# Patient Record
Sex: Male | Born: 1990 | Race: White | Hispanic: No | Marital: Married | Smoking: Current some day smoker
Health system: Southern US, Community
[De-identification: ages and names within clinical notes are randomized; demographics above are authoritative.]

## PROBLEM LIST (undated history)

## (undated) ENCOUNTER — Emergency Department (HOSPITAL_COMMUNITY): Admission: EM | Payer: Self-pay | Source: Home / Self Care

## (undated) DIAGNOSIS — S61412A Laceration without foreign body of left hand, initial encounter: Secondary | ICD-10-CM

## (undated) HISTORY — PX: HAND SURGERY: SHX662

## (undated) HISTORY — PX: NO PAST SURGERIES: SHX2092

---
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
## 2012-12-25 ENCOUNTER — Emergency Department: Payer: Self-pay | Admitting: Emergency Medicine

## 2013-10-01 ENCOUNTER — Emergency Department (HOSPITAL_COMMUNITY)
Admission: EM | Admit: 2013-10-01 | Discharge: 2013-10-01 | Disposition: A | Discharge status: Home / Self Care | Payer: Self-pay | Attending: Emergency Medicine | Admitting: Emergency Medicine

## 2013-10-01 ENCOUNTER — Emergency Department (HOSPITAL_COMMUNITY): Payer: Self-pay

## 2013-10-01 ENCOUNTER — Encounter (HOSPITAL_COMMUNITY): Payer: Self-pay | Admitting: Emergency Medicine

## 2013-10-01 DIAGNOSIS — R1031 Right lower quadrant pain: Secondary | ICD-10-CM | POA: Insufficient documentation

## 2013-10-01 LAB — CBC WITH DIFFERENTIAL/PLATELET
BASOS ABS: 0 10*3/uL (ref 0.0–0.1)
Basophils Relative: 0 % (ref 0–1)
EOS ABS: 0.3 10*3/uL (ref 0.0–0.7)
EOS PCT: 4 % (ref 0–5)
HCT: 48.5 % (ref 39.0–52.0)
Hemoglobin: 16.4 g/dL (ref 13.0–17.0)
Lymphocytes Relative: 31 % (ref 12–46)
Lymphs Abs: 2.8 10*3/uL (ref 0.7–4.0)
MCH: 27.8 pg (ref 26.0–34.0)
MCHC: 33.8 g/dL (ref 30.0–36.0)
MCV: 82.3 fL (ref 78.0–100.0)
Monocytes Absolute: 0.8 10*3/uL (ref 0.1–1.0)
Monocytes Relative: 9 % (ref 3–12)
Neutro Abs: 5 10*3/uL (ref 1.7–7.7)
Neutrophils Relative %: 56 % (ref 43–77)
PLATELETS: 228 10*3/uL (ref 150–400)
RBC: 5.89 MIL/uL — ABNORMAL HIGH (ref 4.22–5.81)
RDW: 13.2 % (ref 11.5–15.5)
WBC: 8.9 10*3/uL (ref 4.0–10.5)

## 2013-10-01 LAB — COMPREHENSIVE METABOLIC PANEL
ALT: 24 U/L (ref 0–53)
AST: 16 U/L (ref 0–37)
Albumin: 3.8 g/dL (ref 3.5–5.2)
Alkaline Phosphatase: 84 U/L (ref 39–117)
Anion gap: 13 (ref 5–15)
BUN: 14 mg/dL (ref 6–23)
CALCIUM: 9.5 mg/dL (ref 8.4–10.5)
CO2: 26 mEq/L (ref 19–32)
CREATININE: 0.62 mg/dL (ref 0.50–1.35)
Chloride: 101 mEq/L (ref 96–112)
GFR calc non Af Amer: >90 mL/min (ref >90)
GLUCOSE: 104 mg/dL — AB (ref 70–99)
Potassium: 4.4 mEq/L (ref 3.7–5.3)
SODIUM: 140 meq/L (ref 137–147)
TOTAL PROTEIN: 7.6 g/dL (ref 6.0–8.3)
Total Bilirubin: 0.3 mg/dL (ref 0.3–1.2)

## 2013-10-01 LAB — LIPASE, BLOOD: LIPASE: 32 U/L (ref 11–59)

## 2013-10-01 MED ORDER — IOHEXOL 300 MG/ML  SOLN
50.0000 mL | Freq: Once | INTRAMUSCULAR | Status: AC | PRN
  Administered 2013-10-01: 50 mL via ORAL

## 2013-10-01 MED ORDER — IOHEXOL 300 MG/ML  SOLN
100.0000 mL | Freq: Once | INTRAMUSCULAR | Status: AC | PRN
  Administered 2013-10-01: 100 mL via INTRAVENOUS

## 2013-10-01 MED ORDER — NAPROXEN 250 MG PO TABS: 250.0000 mg | ORAL_TABLET | Freq: Two times a day (BID) | ORAL | Status: DC | PRN

## 2013-10-01 MED ORDER — SODIUM CHLORIDE 0.9 % IV SOLN
INTRAVENOUS | Status: DC
  Administered 2013-10-01: 14:00:00 via INTRAVENOUS

## 2013-10-01 NOTE — ED Notes (Signed)
Pt c/o right lower quad abd pain that started Friday, denies any n/v/d, denies any fever,

## 2013-10-01 NOTE — Discharge Instructions (Signed)
°Emergency Department Resource Guide °1) Find a Doctor and Pay Out of Pocket °Although you won't have to find out who is covered by your insurance plan, it is a good idea to ask around and get recommendations. You will then need to call the office and see if the doctor you have chosen will accept you as a new patient and what types of options they offer for patients who are self-pay. Some doctors offer discounts or will set up payment plans for their patients who do not have insurance, but you will need to ask so you aren't surprised when you get to your appointment. ° °2) Contact Your Local Health Department °Not all health departments have doctors that can see patients for sick visits, but many do, so it is worth a call to see if yours does. If you don't know where your local health department is, you can check in your phone book. The CDC also has a tool to help you locate your state's health department, and many state websites also have listings of all of their local health departments. ° °3) Find a Walk-in Clinic °If your illness is not likely to be very severe or complicated, you may want to try a walk in clinic. These are popping up all over the country in pharmacies, drugstores, and shopping centers. They're usually staffed by nurse practitioners or physician assistants that have been trained to treat common illnesses and complaints. They're usually fairly quick and inexpensive. However, if you have serious medical issues or chronic medical problems, these are probably not your best option. ° °No Primary Care Doctor: °- Call [facility] at  832-8000 - they can help you locate a primary care doctor that  accepts your insurance, provides certain services, etc. °- Physician Referral Service- 1-800-533-3463 ° °Chronic Pain Problems: °Organization         Address  Phone   Notes  °[facility]  (336) 297-2271 Patients need to be referred by their primary care doctor.  ° °Medication  Assistance: °Organization         Address  Phone   Notes  °[place] [facility] [address] °[place], [place] [address] (336) 641-8030 --Must be a resident of [place] °-- Must have NO insurance coverage whatsoever (no Medicaid/ Medicare, etc.) °-- The pt. MUST have a primary care doctor that directs their care regularly and follows them in the community °  °[facility]  (866) 331-1348   °[employer]  (888) 892-1162   ° °Agencies that provide inexpensive medical care: °Organization         Address  Phone   Notes  °[facility]  (336) 832-8035   °[facility]    (336) 832-7272   °[facility] [address] °[place], [place] [address] (336) 832-4777   °[facility] [address], °[place] (336) 271-4999   °[facility]    (336) 373-0678   °[facility]    (336) 272-1050   °[facility] ° [address], [place] Phone:  (336) 832-4444, Fax:  (336) 832-4440 Hours of Operation:  9 am - 6 pm, M-F.  Also accepts Medicaid/Medicare and self-pay.  °[facility] ° [address], [place] Phone: (336) 832-3150, Fax: (336) [phone]. Hours of Operation:  8:30 am - 5:30 pm, M-F.  Also accepts Medicaid and self-pay.  °[facility] [place] [address], [place] Phone: (336) 878-6027   °[facility] [address], [place], [place] (336)723-1848, Ext. 123 Mondays & Thursdays: 7-9 AM.  First 15 patients are seen on a first come, first serve basis. °  ° °Medicaid-accepting [place] Providers: ° °Organization         Address  Phone   Notes  °[facility] [address], [place] (336) 641-2100 Also accepts self-pay patients.  °[facility] [address], [place] ° (336) 856-9996   °[facility] [address], [place]  (336) 288-8857   °[facility] [address] [place] [address], [place] (336) 299-7000   °Veita Bland [address], [place]  ° (336) 373-1557 Only accepts [place] Access Medicaid patients after they have their name applied to their card.  ° °Self-Pay (no insurance) in [place]: ° °Organization         Address  Phone   Notes  °Sickle Cell Patients, [facility] [address], [place] (336) 832-1970   °[facility] [address], [place] (336) 832-4400   °[facility] ° [address], [place] (336) 992-4800   °[facility]/Dr. Osei-Bonsu ° [address] [place] [address], [place] or [address], [place] (336) 841-8500 Phone number for both [place] and [place] locations is the same.  °[facility] [address], [place] (336) 299-0000   °[facility] [address] [place] [address], [place] or [address] (336) 852-7530 °(336) 878-2260   °[facility] [address], [place] (336) 350-1642, phone; (336) 294-5005, fax Sees patients 1st and 3rd Saturday of every month.  Must not qualify for public or private insurance (i.e. Medicaid, Medicare, [place] Health Choice, Veterans' Benefits) • Household income should be no more than 200% of the poverty level •The clinic cannot treat you if you are pregnant or think you are pregnant • Sexually transmitted diseases are not treated at the clinic.  ° ° °Dental Care: °Organization         Address  Phone  Notes  °[place] [facility] [address], [place] (336) 641-6152 Accepts children up to age 21 who are enrolled in Medicaid or [place] Health Choice; pregnant women with a Medicaid card; and children who have applied for Medicaid or [place] Health Choice, but were declined, whose parents can pay a reduced fee at time of service.  °[place]  [facility] [place]  [address], [place] (336) 641-7733 Accepts children up to age 21 who are enrolled in Medicaid or [place] Health Choice; pregnant women with a Medicaid card; and children who have applied for Medicaid or [place] Health Choice, but were declined, whose parents can pay a reduced fee at time of service.  °[facility] ° [address], [place] (336) 641-4533 Patients are seen by appointment only. Walk-ins are not accepted. [facility] will see patients 18 years of age and older. °Monday - Tuesday (8am-5pm) °Most Wednesdays (8:30-5pm) °$30 per visit, cash only  °[facility] ° [address], [place] (336) 641-4533 Patients are seen by appointment only. Walk-ins are not accepted. [facility] will see patients 18 years of age and older. °One   Wednesday Evening (Monthly: Volunteer Based).  $30 per visit, cash only  °[facility]  (919) 537-3737 for adults; Children under age 4, call Graduate Pediatric Dentistry at ([phone]) [phone]. Children aged 4-14, please call ([phone]) [phone] to request a pediatric application. ° Dental services are provided in all areas of dental care including fillings, crowns and bridges, complete and partial dentures, implants, gum treatment, root canals, and extractions. Preventive care is also provided. Treatment is provided to both adults and children. °Patients are selected via a lottery and there is often a waiting list. °  °[facility] [address], °[place] ° (336) 763-8833 [email or web address] °  °[facility] [address], [place], [place] (336)723-1848, Ext. 123 Second and Fourth Thursday of each month, opens at 6:30 AM; Clinic ends at 9 AM.  Patients are seen on a first-come first-served basis, and a limited number are seen during each clinic.  ° °[facility] ° [address], [place], [place] (336) 723-7904    Eligibility Requirements °You must have lived in [place], [place], or [place] counties for at least the last three months. °  You cannot be eligible for state or federal sponsored healthcare insurance, including Veterans Administration, Medicaid, or Medicare. °  You generally cannot be eligible for healthcare insurance through your employer.  °  How to apply: °Eligibility screenings are held every Tuesday and Wednesday afternoon from 1:00 pm until 4:00 pm. You do not need an appointment for the interview!  °[facility] [address], [place], [place] 336-631-2330   °[facility]  336-342-8273   °[facility]  336-703-3100   °[facility]  336-570-6415   ° °Behavioral Health Resources in the Community: °Intensive Outpatient Programs °Organization         Address  Phone  Notes  °[facility] [address], [place], [place] 336-878-6098   °[facility] [address], [place], [place] 336-832-9800   °[facility] [address], [place], [place] ° 336-882-2125   °[facility] [address],  °[place], [place] 1-800-853-5163 or 336-641-4981   °Substance Abuse Resources °Organization         Address  Phone  Notes  °[facility]  336-882-2125   °[facility]  336-784-9470   °[facility]  336-285-9073   °[facility]  336-845-3988   °Residential & Outpatient Substance Abuse Program  1-800-659-3381   °Psychological Services °Organization         Address  Phone  Notes  °[facility]  336- 832-9600   °[facility]  336- 378-7881   °[facility] [address], [place] 1-800-853-5163 or 336-641-4981   ° °Mobile Crisis Teams °Organization         Address  Phone  Notes  °[facility], Mobile Crisis Care Unit  1-877-626-1772   °Assertive °[facility] ° [address]  [place], [place] 336-834-9664   °Sharon DeEsch [address] °[place] 336-554-5454   ° °Self-Help/Support Groups °Organization         Address  Phone             Notes  °[facility]  - variety of support groups  336- 373-1402 Call for more information  °Narcotics Anonymous (NA), [facility] [address], °[place]  2 meetings at this location  ° °  Residential Treatment Programs Organization         Address  Phone  Notes  [facility] [address],    [place]  [phone]   [facility]  [address], [place] [phone], [place], [place] [phone]   [facility] [address], [place] [phone] Admissions: 8am-3pm M-F  [facility] [address]    [place], [place] [phone]   [facility] [address], [place], [place] [phone]   [facility] [address]  [place], [place] [phone]   [facility] Intensive Outpatient [address], [place], [place] [phone]   [facility] ([facility] [address]  [place], [place] [phone] or [phone]   [facility] [address] [place], [place] [phone] Accepts Medicaid  [facility] [address]  [place] [phone] Substance Abuse/Addiction Treatment   [facility]         Address  Phone  Notes  [facility]  [phone](888) (504) [phone]   Angie FavaJulie Brannon, PhD [address], [address] [place], [place]   [phone](336) [phone] or [phone](336) [phone]   [facility]   [address] [place], [place] [phone](336) [phone]   [facility] [address], [place], [place] ([phone](336) [phone] Insurance/Medicaid/sponsorship through [facility] [address]                                    [place], [place] [phone](336) [phone] Therapy/tele-psych/case    [facility] [address]   [place], [place] [phone](336) [phone]    Dr. Lolly MustacheArfeen  [phone](336) [phone]   [facility] [facility]  [employer] [facility] 1) [address], [place] 2) [address], [place] 3)  [address], [place] [phone](336) [phone](336) [phone]  [phone](336) [phone]   [facility] Child Abuse Hotline 779-302-0344(336) (657) 456-5009 or (904) [phone](336) [phone] (After Hours)       Take the prescription as directed. Take over the counter tylenol, as directed on packaging, as needed for discomfort. Apply moist heat or ice to the area(s) of discomfort, for 15 minutes at a time, several times per day for the next few days.  Do not fall asleep on a heating or ice pack.  Call your regular medical doctor on Monday to schedule a follow up appointment in 2 days.  Return to the Emergency Department immediately if worsening.

## 2013-10-01 NOTE — ED Provider Notes (Signed)
CSN: [id number]     Arrival date & time 10/01/13  1333 History   First MD Initiated Contact with Patient 10/01/13 1351     Chief Complaint  Patient presents with  . Abdominal Pain      HPI Pt was seen at 1355.  Per pt, c/o gradual onset and persistence of constant RLQ abd "pain" that began 2 days ago.  Describes the abd pain as "sore," worsens with palpation of the area and body position changes.  Denies injury, no testicular pain/swelling, no dysuria/hematuria, N/V/D, no fevers, no back pain, no rash, no CP/SOB, no black or blood in stools.       History reviewed. No pertinent past medical history.  History reviewed. No pertinent past surgical history.  History  Substance Use Topics  . Smoking status: Never Smoker   . Smokeless tobacco: Not on file  . Alcohol Use: No    Review of Systems ROS: Statement: All systems negative except as marked or noted in the HPI; Constitutional: Negative for fever and chills. ; ; Eyes: Negative for eye pain, redness and discharge. ; ; ENMT: Negative for ear pain, hoarseness, nasal congestion, sinus pressure and sore throat. ; ; Cardiovascular: Negative for chest pain, palpitations, diaphoresis, dyspnea and peripheral edema. ; ; Respiratory: Negative for cough, wheezing and stridor. ; ; Gastrointestinal: +abd pain. Negative for nausea, vomiting, diarrhea, blood in stool, hematemesis, jaundice and rectal bleeding. . ; ; Genitourinary: Negative for dysuria, flank pain and hematuria. ; ; Genital:  No penile drainage or rash, no testicular pain or swelling, no scrotal rash or swelling. ;; Musculoskeletal: Negative for back pain and neck pain. Negative for swelling and trauma.; ; Skin: Negative for pruritus, rash, abrasions, blisters, bruising and skin lesion.; ; Neuro: Negative for headache, lightheadedness and neck stiffness. Negative for weakness, altered level of consciousness , altered mental status, extremity weakness, paresthesias, involuntary movement,  seizure and syncope.     Allergies  Ceclor and Septra  Home Medications   Prior to Admission medications   Medication Sig Start Date End Date Taking? Authorizing Provider  Aspirin-Caffeine (BAYER BACK & BODY PAIN EX ST) 500-32.5 MG TABS Take 2 tablets by mouth every 6 (six) hours as needed (Pain).   Yes Historical Provider, MD   BP 126/72  Pulse 67  Temp(Src) 97.7 F (36.5 C) (Oral)  Resp 16  Ht 6' (1.829 m)  Wt 249 lb 9 oz (113.201 kg)  BMI 33.84 kg/m2  SpO2 98% Physical Exam 1400; Physical examination:  Nursing notes reviewed; Vital signs and O2 SAT reviewed;  Constitutional: Well developed, Well nourished, Well hydrated, In no acute distress; Head:  Normocephalic, atraumatic; Eyes: EOMI, PERRL, No scleral icterus; ENMT: Mouth and pharynx normal, Mucous membranes moist; Neck: Supple, Full range of motion, No lymphadenopathy; Cardiovascular: Regular rate and rhythm, No murmur, rub, or gallop; Respiratory: Breath sounds clear & equal bilaterally, No rales, rhonchi, wheezes.  Speaking full sentences with ease, Normal respiratory effort/excursion; Chest: Nontender, Movement normal; Abdomen: Soft, +mild RLQ tenderness to palp. No rebound or guarding. Nondistended, Normal bowel sounds; Genitourinary: No CVA tenderness; Extremities: Pulses normal, No tenderness, No edema, No calf edema or asymmetry.; Neuro: AA&Ox3, Major CN grossly intact.  Speech clear. No gross focal motor or sensory deficits in extremities.; Skin: Color normal, Warm, Dry.   ED Course  Procedures     MDM  MDM Reviewed: previous chart, nursing note and vitals Reviewed previous: labs Interpretation: labs and CT scan    Results for orders  placed during the hospital encounter of 10/01/13  CBC WITH DIFFERENTIAL      Result Value Ref Range   WBC 8.9  4.0 - 10.5 K/uL   RBC 5.89 (*) 4.22 - 5.81 MIL/uL   Hemoglobin 16.4  13.0 - 17.0 g/dL   HCT 16.148.5  09.639.0 - 04.552.0 %   MCV 82.3  78.0 - 100.0 fL   MCH 27.8  26.0 - 34.0  pg   MCHC 33.8  30.0 - 36.0 g/dL   RDW 40.913.2  81.111.5 - 91.415.5 %   Platelets 228  150 - 400 K/uL   Neutrophils Relative % 56  43 - 77 %   Neutro Abs 5.0  1.7 - 7.7 K/uL   Lymphocytes Relative 31  12 - 46 %   Lymphs Abs 2.8  0.7 - 4.0 K/uL   Monocytes Relative 9  3 - 12 %   Monocytes Absolute 0.8  0.1 - 1.0 K/uL   Eosinophils Relative 4  0 - 5 %   Eosinophils Absolute 0.3  0.0 - 0.7 K/uL   Basophils Relative 0  0 - 1 %   Basophils Absolute 0.0  0.0 - 0.1 K/uL  COMPREHENSIVE METABOLIC PANEL      Result Value Ref Range   Sodium 140  137 - 147 mEq/L   Potassium 4.4  3.7 - 5.3 mEq/L   Chloride 101  96 - 112 mEq/L   CO2 26  19 - 32 mEq/L   Glucose, Bld 104 (*) 70 - 99 mg/dL   BUN 14  6 - 23 mg/dL   Creatinine, Ser 7.820.62  0.50 - 1.35 mg/dL   Calcium 9.5  8.4 - 95.610.5 mg/dL   Total Protein 7.6  6.0 - 8.3 g/dL   Albumin 3.8  3.5 - 5.2 g/dL   AST 16  0 - 37 U/L   ALT 24  0 - 53 U/L   Alkaline Phosphatase 84  39 - 117 U/L   Total Bilirubin 0.3  0.3 - 1.2 mg/dL   GFR calc non Af Amer >90  >90 mL/min   GFR calc Af Amer >90  >90 mL/min   Anion gap 13  5 - 15  LIPASE, BLOOD      Result Value Ref Range   Lipase 32  11 - 59 U/L   Ct Abdomen Pelvis W Contrast 10/01/2013   CLINICAL DATA:  Right lower quadrant abdominal pain for the past 3 days.  EXAM: CT ABDOMEN AND PELVIS WITH CONTRAST  TECHNIQUE: Multidetector CT imaging of the abdomen and pelvis was performed using the standard protocol following bolus administration of intravenous contrast.  CONTRAST:  50mL OMNIPAQUE IOHEXOL 300 MG/ML SOLN, 100mL OMNIPAQUE IOHEXOL 300 MG/ML SOLN  COMPARISON:  None.  FINDINGS: Normal appearing liver, spleen, pancreas, gallbladder, adrenal glands, kidneys, urinary bladder and prostate gland. No gastrointestinal abnormalities or enlarged lymph nodes. Normal appearing appendix. Clear lung bases. Mild lower thoracic spine degenerative changes.  IMPRESSION: No acute abnormality.  No evidence of appendicitis.   Electronically  Signed   By: Gordan PaymentSteve  Reid M.D.   On: 10/01/2013 15:34    1545:  Workup ressuring. Tx symptomatically. Dx and testing d/w pt and family.  Questions answered.  Verb understanding, agreeable to d/c home with outpt f/u.    Laray AngerKathleen M Keena Heesch, DO 10/03/13 1224

## 2014-03-04 ENCOUNTER — Emergency Department (HOSPITAL_COMMUNITY)
Admission: EM | Admit: 2014-03-04 | Discharge: 2014-03-05 | Disposition: A | Discharge status: Home / Self Care | Payer: Self-pay | Attending: Emergency Medicine | Admitting: Emergency Medicine

## 2014-03-04 ENCOUNTER — Encounter (HOSPITAL_COMMUNITY): Payer: Self-pay

## 2014-03-04 DIAGNOSIS — R1011 Right upper quadrant pain: Secondary | ICD-10-CM | POA: Insufficient documentation

## 2014-03-04 DIAGNOSIS — R197 Diarrhea, unspecified: Secondary | ICD-10-CM | POA: Insufficient documentation

## 2014-03-04 DIAGNOSIS — R112 Nausea with vomiting, unspecified: Secondary | ICD-10-CM | POA: Insufficient documentation

## 2014-03-04 DIAGNOSIS — R1110 Vomiting, unspecified: Secondary | ICD-10-CM

## 2014-03-04 NOTE — ED Provider Notes (Signed)
CSN: [id number]     Arrival date & time 03/04/14  2148 History  This chart was scribed for Bruce SeamenJohn L Jantzen Pilger, MD by Tonye RoyaltyJoshua Chen, ED Scribe. This patient was seen in room APA12/APA12 and the patient's care was started at 11:06 PM.    Chief Complaint  Patient presents with  . Vomiting    The history is provided by the patient. No language interpreter was used.    HPI Comments: Bruce Griffith is a 23 y.o. male who presents to the Emergency Department complaining of nausea and vomiting after eating, onset 1.5 weeks ago. He states that after eating he has either vomits 5-10 minutes later or has diarrhea 30 minutes after; he states they have occurred together only twice. He states no particular type of food seems to cause it and he is able to drink fluids normally. He denies overeating. He states that now he has vomiting after eating approximately every other day. He states he began having RUQ abdominal pain today; he had not been having abdominal pain previously. He rates pain at 4/10 and states it is painful to move or bend. He denies fever, chills, or dysuria. He denies nausea at this time. He reports Fhx of gallbladder and ulcer problems.  History reviewed. No pertinent past medical history. History reviewed. No pertinent past surgical history. No family history on file. History  Substance Use Topics  . Smoking status: Never Smoker   . Smokeless tobacco: Not on file  . Alcohol Use: No    Review of Systems A complete 10 system review of systems was obtained and all systems are negative except as noted in the HPI and PMH.    Allergies  Ceclor and Septra  Home Medications   Prior to Admission medications   Medication Sig Start Date End Date Taking? Authorizing Provider  naproxen (NAPROSYN) 250 MG tablet Take 1 tablet (250 mg total) by mouth 2 (two) times daily as needed for mild pain or moderate pain (Take with food). Patient not taking: Reported on 03/04/2014 10/01/13   Samuel JesterKathleen McManus, DO    BP 142/82 mmHg  Pulse 73  Temp(Src) 98.9 F (37.2 C)  Resp 16  Ht 6' (1.829 m)  Wt 264 lb (119.75 kg)  BMI 35.80 kg/m2  SpO2 100%   Physical Exam  Nursing note and vitals reviewed.  General: Well-developed, well-nourished male in no acute distress; appearance consistent with age of record HENT: normocephalic; atraumatic Eyes: pupils equal, round and reactive to light; extraocular muscles intact Neck: supple Heart: regular rate and rhythm Lungs: clear to auscultation bilaterally Abdomen: soft; nondistended; no masses or hepatosplenomegaly; bowel sounds present; mild RUQ tenderness Extremities: No deformity; full range of motion Neurologic: Awake, alert and oriented; motor function intact in all extremities and symmetric; no facial droop Skin: Warm and dry Psychiatric: Normal mood and affect    ED Course  Procedures (including critical care time)  DIAGNOSTIC STUDIES: Oxygen Saturation is 100% on room air, normal by my interpretation.    COORDINATION OF CARE: 11:13 PM Discussed with patient that since he has not been having pain until today, his symptoms are unlikely to be due to gallbladder ot ulcer problems. Discussed treatment plan with patient at beside, including lab work. The patient agrees with the plan and has no further questions at this time.    MDM   Nursing notes and vitals signs, including pulse oximetry, reviewed.  Summary of this visit's results, reviewed by myself:  Labs:  Results for orders placed  or performed during the hospital encounter of 03/04/14 (from the past 24 hour(s))  CBC with Differential     Status: Abnormal   Collection Time: 03/05/14 12:16 AM  Result Value Ref Range   WBC 10.9 (H) 4.0 - 10.5 K/uL   RBC 5.69 4.22 - 5.81 MIL/uL   Hemoglobin 15.6 13.0 - 17.0 g/dL   HCT 16.146.6 09.639.0 - 04.552.0 %   MCV 81.9 78.0 - 100.0 fL   MCH 27.4 26.0 - 34.0 pg   MCHC 33.5 30.0 - 36.0 g/dL   RDW 40.913.5 81.111.5 - 91.415.5 %   Platelets 278 150 - 400 K/uL    Neutrophils Relative % 55 43 - 77 %   Neutro Abs 6.1 1.7 - 7.7 K/uL   Lymphocytes Relative 32 12 - 46 %   Lymphs Abs 3.5 0.7 - 4.0 K/uL   Monocytes Relative 10 3 - 12 %   Monocytes Absolute 1.1 (H) 0.1 - 1.0 K/uL   Eosinophils Relative 3 0 - 5 %   Eosinophils Absolute 0.3 0.0 - 0.7 K/uL   Basophils Relative 0 0 - 1 %   Basophils Absolute 0.0 0.0 - 0.1 K/uL  Comprehensive metabolic panel     Status: None   Collection Time: 03/05/14 12:16 AM  Result Value Ref Range   Sodium 142 137 - 147 mEq/L   Potassium 4.0 3.7 - 5.3 mEq/L   Chloride 103 96 - 112 mEq/L   CO2 28 19 - 32 mEq/L   Glucose, Bld 82 70 - 99 mg/dL   BUN 7 6 - 23 mg/dL   Creatinine, Ser 7.820.69 0.50 - 1.35 mg/dL   Calcium 9.1 8.4 - 95.610.5 mg/dL   Total Protein 7.1 6.0 - 8.3 g/dL   Albumin 3.6 3.5 - 5.2 g/dL   AST 18 0 - 37 U/L   ALT 29 0 - 53 U/L   Alkaline Phosphatase 88 39 - 117 U/L   Total Bilirubin 0.5 0.3 - 1.2 mg/dL   GFR calc non Af Amer >90 >90 mL/min   GFR calc Af Amer >90 >90 mL/min   Anion gap 11 5 - 15  Lipase, blood     Status: None   Collection Time: 03/05/14 12:16 AM  Result Value Ref Range   Lipase 27 11 - 59 U/L   2:16 AM Patient's pain is improved and he is minimally tender in the right upper quadrant. The cause of his symptoms is not apparent at this time. We will treat him symptomatically and refer him to gastroenterology for follow-up.  I personally performed the services described in this documentation, which was scribed in my presence. The recorded information has been reviewed and is accurate.   Bruce SeamenJohn L Chanie Soucek, MD 03/05/14 [phone]

## 2014-03-04 NOTE — ED Notes (Signed)
Intermittent nausea and vomiting for 10 days, upper abd pain started today

## 2014-03-05 LAB — COMPREHENSIVE METABOLIC PANEL
ALK PHOS: 88 U/L (ref 39–117)
ALT: 29 U/L (ref 0–53)
AST: 18 U/L (ref 0–37)
Albumin: 3.6 g/dL (ref 3.5–5.2)
Anion gap: 11 (ref 5–15)
BILIRUBIN TOTAL: 0.5 mg/dL (ref 0.3–1.2)
BUN: 7 mg/dL (ref 6–23)
CHLORIDE: 103 meq/L (ref 96–112)
CO2: 28 meq/L (ref 19–32)
Calcium: 9.1 mg/dL (ref 8.4–10.5)
Creatinine, Ser: 0.69 mg/dL (ref 0.50–1.35)
GLUCOSE: 82 mg/dL (ref 70–99)
POTASSIUM: 4 meq/L (ref 3.7–5.3)
SODIUM: 142 meq/L (ref 137–147)
Total Protein: 7.1 g/dL (ref 6.0–8.3)

## 2014-03-05 LAB — CBC WITH DIFFERENTIAL/PLATELET
Basophils Absolute: 0 10*3/uL (ref 0.0–0.1)
Basophils Relative: 0 % (ref 0–1)
EOS PCT: 3 % (ref 0–5)
Eosinophils Absolute: 0.3 10*3/uL (ref 0.0–0.7)
HCT: 46.6 % (ref 39.0–52.0)
Hemoglobin: 15.6 g/dL (ref 13.0–17.0)
LYMPHS ABS: 3.5 10*3/uL (ref 0.7–4.0)
LYMPHS PCT: 32 % (ref 12–46)
MCH: 27.4 pg (ref 26.0–34.0)
MCHC: 33.5 g/dL (ref 30.0–36.0)
MCV: 81.9 fL (ref 78.0–100.0)
Monocytes Absolute: 1.1 10*3/uL — ABNORMAL HIGH (ref 0.1–1.0)
Monocytes Relative: 10 % (ref 3–12)
NEUTROS ABS: 6.1 10*3/uL (ref 1.7–7.7)
NEUTROS PCT: 55 % (ref 43–77)
PLATELETS: 278 10*3/uL (ref 150–400)
RBC: 5.69 MIL/uL (ref 4.22–5.81)
RDW: 13.5 % (ref 11.5–15.5)
WBC: 10.9 10*3/uL — AB (ref 4.0–10.5)

## 2014-03-05 LAB — LIPASE, BLOOD: Lipase: 27 U/L (ref 11–59)

## 2014-03-05 MED ORDER — ONDANSETRON 8 MG PO TBDP: 8.0000 mg | ORAL_TABLET | Freq: Three times a day (TID) | ORAL | Status: DC | PRN

## 2014-03-05 NOTE — Discharge Instructions (Signed)

## 2014-07-09 ENCOUNTER — Encounter (HOSPITAL_COMMUNITY): Payer: Self-pay | Admitting: Cardiology

## 2014-07-09 ENCOUNTER — Emergency Department (HOSPITAL_COMMUNITY)
Admission: EM | Admit: 2014-07-09 | Discharge: 2014-07-09 | Disposition: A | Discharge status: Home / Self Care | Payer: Self-pay | Attending: Emergency Medicine | Admitting: Emergency Medicine

## 2014-07-09 DIAGNOSIS — J029 Acute pharyngitis, unspecified: Secondary | ICD-10-CM | POA: Insufficient documentation

## 2014-07-09 DIAGNOSIS — Z79899 Other long term (current) drug therapy: Secondary | ICD-10-CM | POA: Insufficient documentation

## 2014-07-09 MED ORDER — PENICILLIN G BENZATHINE 1200000 UNIT/2ML IM SUSP
1.2000 10*6.[IU] | Freq: Once | INTRAMUSCULAR | Status: AC
  Administered 2014-07-09: 1.2 10*6.[IU] via INTRAMUSCULAR
  Filled 2014-07-09: qty 2

## 2014-07-09 MED ORDER — DIPHENHYDRAMINE HCL 25 MG PO CAPS
25.0000 mg | ORAL_CAPSULE | Freq: Once | ORAL | Status: AC
  Administered 2014-07-09: 25 mg via ORAL
  Filled 2014-07-09: qty 1

## 2014-07-09 MED ORDER — IBUPROFEN 800 MG PO TABS
800.0000 mg | ORAL_TABLET | Freq: Once | ORAL | Status: AC
  Administered 2014-07-09: 800 mg via ORAL
  Filled 2014-07-09: qty 1

## 2014-07-09 MED ORDER — ACETAMINOPHEN-CODEINE #3 300-30 MG PO TABS: 1.0000 | ORAL_TABLET | Freq: Four times a day (QID) | ORAL | Status: DC | PRN

## 2014-07-09 NOTE — ED Notes (Signed)
Sore throat  Since yesterday.  Throat red , with swollen tonsils with exudate.

## 2014-07-09 NOTE — Discharge Instructions (Signed)
Your examination is consistent with a severe pharyngitis. Please increase fluids. Please wash hands frequently. Please use salt water gargles every 4 hours. Use Tylenol every 4 hours, or ibuprofen every 6 hours for fever or aching. Use Tylenol codeine for more severe pain. This medication may cause drowsiness, please use with caution. Please use a mask until symptoms have completely resolved. Pharyngitis Pharyngitis is a sore throat (pharynx). There is redness, pain, and swelling of your throat. HOME CARE   Drink enough fluids to keep your pee (urine) clear or pale yellow.  Only take medicine as told by your doctor.  You may get sick again if you do not take medicine as told. Finish your medicines, even if you start to feel better.  Do not take aspirin.  Rest.  Rinse your mouth (gargle) with salt water ( tsp of salt per 1 qt of water) every 1-2 hours. This will help the pain.  If you are not at risk for choking, you can suck on hard candy or sore throat lozenges. GET HELP IF:  You have large, tender lumps on your neck.  You have a rash.  You cough up green, yellow-brown, or bloody spit. GET HELP RIGHT AWAY IF:   You have a stiff neck.  You drool or cannot swallow liquids.  You throw up (vomit) or are not able to keep medicine or liquids down.  You have very bad pain that does not go away with medicine.  You have problems breathing (not from a stuffy nose). MAKE SURE YOU:   Understand these instructions.  Will watch your condition.  Will get help right away if you are not doing well or get worse. Document Released: 09/02/2007 Document Revised: 01/04/2013 Document Reviewed: 11/21/2012 [facility] Patient Information 2015 [place]. This information is not intended to replace advice given to you by your health care provider. Make sure you discuss any questions you have with your health care provider.

## 2014-07-09 NOTE — ED Notes (Signed)
Fever and sore throat since yesterday.  

## 2014-07-09 NOTE — ED Provider Notes (Signed)
CSN: [id number]     Arrival date & time 07/09/14  1013 History  This chart was scribed for non-physician practitioner, Ivery QualeHobson Varick Keys, working with Shon Batonourtney F Horton, MD by Richarda Overlieichard Holland, ED Scribe. This patient was seen in room APFT24/APFT24 and the patient's care was started at 11:39 AM.    Chief Complaint  Patient presents with  . Sore Throat   HPI HPI Comments: Bruce Griffith is a 24 y.o. male with a history of vertigo who presents to the Emergency Department complaining of a sore throat that started yesterday morning. Pt reports associated subjective fever, nausea, vomiting yesterday and rhinorrhea as well. He says that he is able to keep fluids down at this time. Pt states he takes no medications regularly. He reports no alleviating or exacerbating factors at this time. He reports he is allergic ceclor and septra. Pt denies rash.   History reviewed. No pertinent past medical history. History reviewed. No pertinent past surgical history. History reviewed. No pertinent family history. History  Substance Use Topics  . Smoking status: Never Smoker   . Smokeless tobacco: Not on file  . Alcohol Use: No    Review of Systems  Constitutional: Positive for fever.  HENT: Positive for sore throat.   Gastrointestinal: Positive for nausea and vomiting.  All other systems reviewed and are negative.  Allergies  Ceclor and Septra  Home Medications   Prior to Admission medications   Medication Sig Start Date End Date Taking? Authorizing Provider  ondansetron (ZOFRAN ODT) 8 MG disintegrating tablet Take 1 tablet (8 mg total) by mouth every 8 (eight) hours as needed for nausea or vomiting. 8mg  ODT q4 hours prn nausea Patient not taking: Reported on 07/09/2014 03/05/14   Paula LibraJohn Molpus, MD   BP 123/73 mmHg  Pulse 101  Temp(Src) 99.6 F (37.6 C) (Oral)  Resp 18  Ht 6' (1.829 m)  Wt 265 lb (120.203 kg)  BMI 35.93 kg/m2  SpO2 99% Physical Exam  Constitutional: He is oriented to person,  place, and time. He appears well-developed and well-nourished.  HENT:  Head: Normocephalic and atraumatic.  Right Ear: Tympanic membrane, external ear and ear canal normal.  Left Ear: Tympanic membrane, external ear and ear canal normal.  Mouth/Throat: Oropharyngeal exudate present.  Bilateral tonsil swelling. Exudate present. Uvula is enlarged but in the midline. Airway is patent.   Neck: Neck supple. No tracheal deviation present.  Few palpable submental nodes. Trachea is midline.   Cardiovascular: Normal rate, regular rhythm and normal heart sounds.   Pulmonary/Chest: Effort normal and breath sounds normal. No respiratory distress. He has no wheezes. He has no rales.  Abdominal: He exhibits no distension.  Neurological: He is alert and oriented to person, place, and time.  Skin: Skin is warm and dry. No rash noted.  No rash in the palms.   Psychiatric: He has a normal mood and affect. His behavior is normal.  Nursing note and vitals reviewed.   ED Course  Procedures   DIAGNOSTIC STUDIES: Oxygen Saturation is 99% on RA, normal by my interpretation.    COORDINATION OF CARE: 11:46 AM Discussed treatment plan with pt at bedside and pt agreed to plan.   Labs Review Labs Reviewed - No data to display  Imaging Review No results found.   EKG Interpretation None      MDM  Pt tachycardic at 101. Temp 99.6 Pulse ox 99% on room air.  Exam is consistent with pharyngitis. No rash. No excessive headache or neck stiffness.  Plan - salt water gargles. Increase fluids. Bicillin IM and Rx for tylenol codeine.    Final diagnoses:  None    **I have reviewed nursing notes, vital signs, and all appropriate lab and imaging results for this patient. I personally performed the services described in this documentation, which was scribed in my presence. The recorded information has been reviewed and is accurate.     Ivery Quale, PA-C 07/12/14 0454  Shon Baton, MD 07/13/14  [phone]

## 2014-08-13 ENCOUNTER — Encounter (HOSPITAL_COMMUNITY): Payer: Self-pay | Admitting: Emergency Medicine

## 2014-08-13 ENCOUNTER — Emergency Department (HOSPITAL_COMMUNITY)
Admission: EM | Admit: 2014-08-13 | Discharge: 2014-08-13 | Disposition: A | Discharge status: Home / Self Care | Payer: Self-pay | Attending: Emergency Medicine | Admitting: Emergency Medicine

## 2014-08-13 DIAGNOSIS — Z79899 Other long term (current) drug therapy: Secondary | ICD-10-CM | POA: Insufficient documentation

## 2014-08-13 DIAGNOSIS — J039 Acute tonsillitis, unspecified: Secondary | ICD-10-CM | POA: Insufficient documentation

## 2014-08-13 MED ORDER — DEXAMETHASONE SODIUM PHOSPHATE 4 MG/ML IJ SOLN
8.0000 mg | Freq: Once | INTRAMUSCULAR | Status: AC
  Administered 2014-08-13: 8 mg via INTRAMUSCULAR
  Filled 2014-08-13: qty 2

## 2014-08-13 MED ORDER — CLINDAMYCIN HCL 300 MG PO CAPS: 300.0000 mg | ORAL_CAPSULE | Freq: Three times a day (TID) | ORAL | Status: DC

## 2014-08-13 MED ORDER — CLINDAMYCIN HCL 150 MG PO CAPS
300.0000 mg | ORAL_CAPSULE | Freq: Once | ORAL | Status: AC
  Administered 2014-08-13: 300 mg via ORAL
  Filled 2014-08-13: qty 2

## 2014-08-13 NOTE — ED Provider Notes (Signed)
CSN: [id number]     Arrival date & time 08/13/14  1634 History  This chart was scribed for Burgess AmorJulie Brigido Mera, PA-C, working with Nelva Nayobert Beaton, MD by Leona CarryG. Clay Sherrill, ED Scribe. The patient was seen in APFT21/APFT21. The patient's care was started at 6:43 PM.     Chief Complaint  Patient presents with  . Sore Throat   HPI HPI Comments: Bruce Griffith is a 24 y.o. male who presents to the Emergency Department complaining of a constant sore throat beginning approximately five days ago. Patient reports that the pain was more severe when he woke up this morning. He reports that he developed tightness in his throat while at work today.  Patient states that he has taken cough drops and a "sore throat spray" with no relief of his symptoms. Patient denies ear pain or SOB. Patient denies a history of smoking.  Patient does not have a PCP.   History reviewed. No pertinent past medical history. History reviewed. No pertinent past surgical history. History reviewed. No pertinent family history. History  Substance Use Topics  . Smoking status: Never Smoker   . Smokeless tobacco: Not on file  . Alcohol Use: No    Review of Systems  Constitutional: Negative for fever and chills.  HENT: Positive for sore throat. Negative for congestion, ear pain, rhinorrhea, sinus pressure, trouble swallowing and voice change.   Eyes: Negative for discharge.  Respiratory: Positive for cough. Negative for shortness of breath, wheezing and stridor.   Cardiovascular: Negative for chest pain.  Gastrointestinal: Negative for abdominal pain.  Genitourinary: Negative.       Allergies  Ceclor and Septra  Home Medications   Prior to Admission medications   Medication Sig Start Date End Date Taking? Authorizing Provider  acetaminophen-codeine (TYLENOL #3) 300-30 MG per tablet Take 1-2 tablets by mouth every 6 (six) hours as needed for moderate pain. 07/09/14   Ivery QualeHobson Bryant, PA-C  clindamycin (CLEOCIN) 300 MG capsule Take  1 capsule (300 mg total) by mouth 3 (three) times daily. 08/13/14   Burgess AmorJulie Danny Zimny, PA-C  ondansetron (ZOFRAN ODT) 8 MG disintegrating tablet Take 1 tablet (8 mg total) by mouth every 8 (eight) hours as needed for nausea or vomiting. 8mg  ODT q4 hours prn nausea Patient not taking: Reported on 07/09/2014 03/05/14   Paula LibraJohn Molpus, MD   Triage Vitals: BP 130/82 mmHg  Pulse 86  Temp(Src) 99.5 F (37.5 C) (Oral)  Resp 20  Ht 6\' 1"  (1.854 m)  Wt 265 lb (120.203 kg)  BMI 34.97 kg/m2  SpO2 100% Physical Exam  Constitutional: He is oriented to person, place, and time. He appears well-developed and well-nourished.  HENT:  Head: Normocephalic and atraumatic.  Right Ear: Tympanic membrane and ear canal normal.  Left Ear: Tympanic membrane and ear canal normal.  Nose: No mucosal edema or rhinorrhea.  Mouth/Throat: Uvula is midline, oropharynx is clear and moist and mucous membranes are normal. No oropharyngeal exudate, posterior oropharyngeal edema, posterior oropharyngeal erythema or tonsillar abscesses.  3+ hypertrophy tonsils touching the uvula. Symmetric. No surrounding edema. Erythema with traces of exudate.  Eyes: Conjunctivae are normal.  Cardiovascular: Normal rate and normal heart sounds.   Pulmonary/Chest: Effort normal and breath sounds normal. No stridor. No respiratory distress. He has no wheezes. He has no rales.  Abdominal: Soft. There is no tenderness.  Musculoskeletal: Normal range of motion.  Neurological: He is alert and oriented to person, place, and time.  Skin: Skin is warm and dry. No rash noted.  Psychiatric: He has a normal mood and affect.    ED Course  Procedures (including critical care time) DIAGNOSTIC STUDIES: Oxygen Saturation is 100% on room air, normal by my interpretation.    COORDINATION OF CARE:    Labs Review Labs Reviewed - No data to display  Imaging Review No results found.   EKG Interpretation None      MDM   Final diagnoses:  Acute  tonsillitis    Pt with acute tonsillitis.  Decadron 10 mg IM given to help reduce swelling.  Clindamycin prescribed as he was treated for similar sx just 3 weeks ago with Bicillin.  Advised recheck for any worsened sx.  Rest, increased fluid intake, motrin for fever, throat pain.  Pt concerned about need to have tonsillectomy. Advised patient that it is probably not in his best interest to have this surgery, but if he continues to have episodes of tonsillitis he may discuss with ENT. Referral given for prn need for recurrent or persistent sx.   The patient appears reasonably screened and/or stabilized for discharge and I doubt any other medical condition or other [facility] requiring further screening, evaluation, or treatment in the ED at this time prior to discharge.  I personally performed the services described in this documentation, which was scribed in my presence. The recorded information has been reviewed and is accurate.   Burgess AmorJulie Klayton Monie, PA-C 08/15/14 [id number]  Nelva Nayobert Beaton, MD 08/16/14 [phone]

## 2014-08-13 NOTE — ED Notes (Signed)
Pt states that he has had a sore throat for about a week.

## 2014-08-13 NOTE — Discharge Instructions (Signed)

## 2014-08-13 NOTE — ED Notes (Signed)
J. Idol, PA at bedside. 

## 2015-09-09 ENCOUNTER — Encounter (HOSPITAL_COMMUNITY): Payer: Self-pay | Admitting: Emergency Medicine

## 2015-09-09 ENCOUNTER — Emergency Department (HOSPITAL_COMMUNITY)
Admission: EM | Admit: 2015-09-09 | Discharge: 2015-09-09 | Disposition: A | Discharge status: Home / Self Care | Payer: Self-pay | Attending: Emergency Medicine | Admitting: Emergency Medicine

## 2015-09-09 DIAGNOSIS — R55 Syncope and collapse: Secondary | ICD-10-CM | POA: Insufficient documentation

## 2015-09-09 DIAGNOSIS — L409 Psoriasis, unspecified: Secondary | ICD-10-CM | POA: Insufficient documentation

## 2015-09-09 DIAGNOSIS — Z79899 Other long term (current) drug therapy: Secondary | ICD-10-CM | POA: Insufficient documentation

## 2015-09-09 LAB — BASIC METABOLIC PANEL
ANION GAP: 9 (ref 5–15)
BUN: 11 mg/dL (ref 6–20)
CALCIUM: 9.3 mg/dL (ref 8.9–10.3)
CO2: 26 mmol/L (ref 22–32)
Chloride: 103 mmol/L (ref 101–111)
Creatinine, Ser: 0.74 mg/dL (ref 0.61–1.24)
GFR calc Af Amer: >60 mL/min (ref >60)
Glucose, Bld: 100 mg/dL — ABNORMAL HIGH (ref 65–99)
Potassium: 4 mmol/L (ref 3.5–5.1)
Sodium: 138 mmol/L (ref 135–145)

## 2015-09-09 LAB — CBC
HEMATOCRIT: 48.1 % (ref 39.0–52.0)
Hemoglobin: 15.3 g/dL (ref 13.0–17.0)
MCH: 26.1 pg (ref 26.0–34.0)
MCHC: 31.8 g/dL (ref 30.0–36.0)
MCV: 82.1 fL (ref 78.0–100.0)
PLATELETS: 245 10*3/uL (ref 150–400)
RBC: 5.86 MIL/uL — ABNORMAL HIGH (ref 4.22–5.81)
RDW: 13.3 % (ref 11.5–15.5)
WBC: 13.2 10*3/uL — AB (ref 4.0–10.5)

## 2015-09-09 LAB — CBG MONITORING, ED: GLUCOSE-CAPILLARY: 132 mg/dL — AB (ref 65–99)

## 2015-09-09 MED ORDER — SODIUM CHLORIDE 0.9 % IV BOLUS (SEPSIS)
1000.0000 mL | Freq: Once | INTRAVENOUS | Status: AC
  Administered 2015-09-09: 1000 mL via INTRAVENOUS
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
## 2015-09-09 NOTE — ED Notes (Signed)
CBG 132 in triage.

## 2015-09-09 NOTE — ED Notes (Signed)
Pt alert & oriented x4, stable gait. Patient given discharge instructions, paperwork & prescription(s). Patient  instructed to stop at the registration desk to finish any additional paperwork. Patient verbalized understanding. Pt left department w/ no further questions. 

## 2015-09-09 NOTE — Discharge Instructions (Signed)

## 2015-09-09 NOTE — ED Notes (Signed)
Pt ambulated to restroom & returned to room w/ no complications. 

## 2016-04-14 ENCOUNTER — Emergency Department (HOSPITAL_COMMUNITY)
Admission: EM | Admit: 2016-04-14 | Discharge: 2016-04-15 | Disposition: A | Discharge status: Home / Self Care | Payer: Self-pay | Attending: Emergency Medicine | Admitting: Emergency Medicine

## 2016-04-14 ENCOUNTER — Emergency Department (HOSPITAL_COMMUNITY): Payer: Self-pay

## 2016-04-14 ENCOUNTER — Encounter (HOSPITAL_COMMUNITY): Payer: Self-pay

## 2016-04-14 DIAGNOSIS — S60552A Superficial foreign body of left hand, initial encounter: Secondary | ICD-10-CM

## 2016-04-14 DIAGNOSIS — Z23 Encounter for immunization: Secondary | ICD-10-CM | POA: Insufficient documentation

## 2016-04-14 DIAGNOSIS — Y999 Unspecified external cause status: Secondary | ICD-10-CM | POA: Insufficient documentation

## 2016-04-14 DIAGNOSIS — Y9389 Activity, other specified: Secondary | ICD-10-CM | POA: Insufficient documentation

## 2016-04-14 DIAGNOSIS — W260XXA Contact with knife, initial encounter: Secondary | ICD-10-CM | POA: Insufficient documentation

## 2016-04-14 DIAGNOSIS — S61412A Laceration without foreign body of left hand, initial encounter: Secondary | ICD-10-CM

## 2016-04-14 DIAGNOSIS — S61422A Laceration with foreign body of left hand, initial encounter: Secondary | ICD-10-CM | POA: Insufficient documentation

## 2016-04-14 DIAGNOSIS — Y92009 Unspecified place in unspecified non-institutional (private) residence as the place of occurrence of the external cause: Secondary | ICD-10-CM | POA: Insufficient documentation

## 2016-04-14 HISTORY — DX: Laceration without foreign body of left hand, initial encounter: S61.412A

## 2016-04-14 MED ORDER — CEPHALEXIN 250 MG PO CAPS
500.0000 mg | ORAL_CAPSULE | Freq: Once | ORAL | Status: AC
  Administered 2016-04-15: 500 mg via ORAL
  Filled 2016-04-14: qty 2

## 2016-04-14 MED ORDER — TETANUS-DIPHTH-ACELL PERTUSSIS 5-2.5-18.5 LF-MCG/0.5 IM SUSP
0.5000 mL | Freq: Once | INTRAMUSCULAR | Status: AC
  Administered 2016-04-15: 0.5 mL via INTRAMUSCULAR
  Filled 2016-04-14: qty 0.5

## 2016-04-14 MED ORDER — LIDOCAINE HCL (PF) 1 % IJ SOLN
10.0000 mL | Freq: Once | INTRAMUSCULAR | Status: AC
  Administered 2016-04-15: 10 mL via INTRADERMAL
  Filled 2016-04-14: qty 10
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
## 2016-04-15 MED ORDER — CEPHALEXIN 500 MG PO CAPS: 500.0000 mg | ORAL_CAPSULE | Freq: Two times a day (BID) | ORAL | 0 refills | Status: AC

## 2016-04-17 ENCOUNTER — Encounter (HOSPITAL_BASED_OUTPATIENT_CLINIC_OR_DEPARTMENT_OTHER): Payer: Self-pay | Admitting: *Deleted

## 2016-04-20 ENCOUNTER — Ambulatory Visit (HOSPITAL_BASED_OUTPATIENT_CLINIC_OR_DEPARTMENT_OTHER): Payer: Self-pay | Admitting: Anesthesiology

## 2016-04-20 ENCOUNTER — Encounter (HOSPITAL_BASED_OUTPATIENT_CLINIC_OR_DEPARTMENT_OTHER): Payer: Self-pay | Admitting: Anesthesiology

## 2016-04-20 ENCOUNTER — Ambulatory Visit (HOSPITAL_BASED_OUTPATIENT_CLINIC_OR_DEPARTMENT_OTHER)
Admission: RE | Admit: 2016-04-20 | Discharge: 2016-04-20 | Disposition: A | Discharge status: Home / Self Care | Payer: Self-pay | Source: Ambulatory Visit | Attending: Orthopedic Surgery | Admitting: Orthopedic Surgery

## 2016-04-20 ENCOUNTER — Encounter (HOSPITAL_BASED_OUTPATIENT_CLINIC_OR_DEPARTMENT_OTHER)
Admission: RE | Disposition: A | Discharge status: Home / Self Care | Payer: Self-pay | Source: Ambulatory Visit | Attending: Orthopedic Surgery

## 2016-04-20 DIAGNOSIS — S61412A Laceration without foreign body of left hand, initial encounter: Secondary | ICD-10-CM | POA: Insufficient documentation

## 2016-04-20 DIAGNOSIS — F17290 Nicotine dependence, other tobacco product, uncomplicated: Secondary | ICD-10-CM | POA: Insufficient documentation

## 2016-04-20 DIAGNOSIS — W458XXA Other foreign body or object entering through skin, initial encounter: Secondary | ICD-10-CM | POA: Insufficient documentation

## 2016-04-20 HISTORY — PX: WOUND EXPLORATION: SHX6188

## 2016-04-20 HISTORY — DX: Laceration without foreign body of left hand, initial encounter: S61.412A

## 2016-04-20 SURGERY — WOUND EXPLORATION
Anesthesia: General | Site: Hand | Laterality: Left

## 2016-04-20 MED ORDER — HYDROMORPHONE HCL 1 MG/ML IJ SOLN
0.2500 mg | INTRAMUSCULAR | Status: DC | PRN
  Administered 2016-04-20 (×2): 0.5 mg via INTRAVENOUS

## 2016-04-20 MED ORDER — LIDOCAINE HCL (PF) 1 % IJ SOLN
INTRAMUSCULAR | Status: AC
Start: 2016-04-20 — End: 2016-04-20
  Filled 2016-04-20: qty 30

## 2016-04-20 MED ORDER — HYDROCODONE-ACETAMINOPHEN 5-325 MG PO TABS: ORAL_TABLET | ORAL | 0 refills | Status: DC

## 2016-04-20 MED ORDER — HEPARIN SODIUM (PORCINE) 1000 UNIT/ML IJ SOLN
INTRAMUSCULAR | Status: AC
  Filled 2016-04-20: qty 1

## 2016-04-20 MED ORDER — CEFAZOLIN SODIUM-DEXTROSE 2-3 GM-% IV SOLR
INTRAVENOUS | Status: DC | PRN
  Administered 2016-04-20: 2 g via INTRAVENOUS

## 2016-04-20 MED ORDER — OXYCODONE HCL 5 MG PO TABS: 5.0000 mg | ORAL_TABLET | Freq: Once | ORAL | Status: DC | PRN

## 2016-04-20 MED ORDER — PROPOFOL 10 MG/ML IV BOLUS
INTRAVENOUS | Status: DC | PRN
  Administered 2016-04-20: 250 mg via INTRAVENOUS

## 2016-04-20 MED ORDER — FENTANYL CITRATE (PF) 100 MCG/2ML IJ SOLN
INTRAMUSCULAR | Status: AC
  Filled 2016-04-20: qty 2

## 2016-04-20 MED ORDER — MIDAZOLAM HCL 2 MG/2ML IJ SOLN
1.0000 mg | INTRAMUSCULAR | Status: DC | PRN
  Administered 2016-04-20: 2 mg via INTRAVENOUS

## 2016-04-20 MED ORDER — MIDAZOLAM HCL 2 MG/2ML IJ SOLN
INTRAMUSCULAR | Status: AC
  Filled 2016-04-20: qty 2

## 2016-04-20 MED ORDER — PROMETHAZINE HCL 25 MG/ML IJ SOLN: 6.2500 mg | INTRAMUSCULAR | Status: DC | PRN

## 2016-04-20 MED ORDER — OXYCODONE HCL 5 MG/5ML PO SOLN: 5.0000 mg | Freq: Once | ORAL | Status: DC | PRN

## 2016-04-20 MED ORDER — SCOPOLAMINE 1 MG/3DAYS TD PT72
1.0000 | MEDICATED_PATCH | Freq: Once | TRANSDERMAL | Status: DC | PRN
Start: 2016-04-20 — End: 2016-04-20

## 2016-04-20 MED ORDER — CEFAZOLIN SODIUM-DEXTROSE 2-4 GM/100ML-% IV SOLN
2.0000 g | Freq: Once | INTRAVENOUS | Status: AC
  Administered 2016-04-20: 2 g via INTRAVENOUS

## 2016-04-20 MED ORDER — DEXAMETHASONE SODIUM PHOSPHATE 10 MG/ML IJ SOLN
INTRAMUSCULAR | Status: DC | PRN
  Administered 2016-04-20: 10 mg via INTRAVENOUS

## 2016-04-20 MED ORDER — PROPOFOL 10 MG/ML IV BOLUS
INTRAVENOUS | Status: AC
  Filled 2016-04-20: qty 40

## 2016-04-20 MED ORDER — HYDROMORPHONE HCL 1 MG/ML IJ SOLN
INTRAMUSCULAR | Status: AC
  Filled 2016-04-20: qty 1

## 2016-04-20 MED ORDER — FENTANYL CITRATE (PF) 100 MCG/2ML IJ SOLN
50.0000 ug | INTRAMUSCULAR | Status: AC | PRN
  Administered 2016-04-20 (×2): 50 ug via INTRAVENOUS
  Administered 2016-04-20: 100 ug via INTRAVENOUS

## 2016-04-20 MED ORDER — LACTATED RINGERS IV SOLN
INTRAVENOUS | Status: DC
  Administered 2016-04-20 (×2): via INTRAVENOUS

## 2016-04-20 MED ORDER — ONDANSETRON HCL 4 MG/2ML IJ SOLN
INTRAMUSCULAR | Status: AC
  Filled 2016-04-20: qty 2

## 2016-04-20 MED ORDER — CEFAZOLIN SODIUM-DEXTROSE 2-4 GM/100ML-% IV SOLN
INTRAVENOUS | Status: AC
  Filled 2016-04-20: qty 100

## 2016-04-20 MED ORDER — LIDOCAINE 2% (20 MG/ML) 5 ML SYRINGE
INTRAMUSCULAR | Status: DC | PRN
  Administered 2016-04-20: 100 mg via INTRAVENOUS

## 2016-04-20 MED ORDER — DEXAMETHASONE SODIUM PHOSPHATE 10 MG/ML IJ SOLN
INTRAMUSCULAR | Status: AC
  Filled 2016-04-20: qty 1

## 2016-04-20 MED ORDER — BUPIVACAINE HCL (PF) 0.25 % IJ SOLN
INTRAMUSCULAR | Status: AC
  Filled 2016-04-20: qty 30

## 2016-04-20 MED ORDER — BUPIVACAINE HCL (PF) 0.25 % IJ SOLN
INTRAMUSCULAR | Status: DC | PRN
  Administered 2016-04-20: 10 mL

## 2016-04-20 MED ORDER — MEPERIDINE HCL 25 MG/ML IJ SOLN: 6.2500 mg | INTRAMUSCULAR | Status: DC | PRN

## 2016-04-20 SURGICAL SUPPLY — 42 items
BANDAGE COBAN STERILE 2 (GAUZE/BANDAGES/DRESSINGS) ×3 IMPLANT
BLADE SURG 15 STRL LF DISP TIS (BLADE) ×4 IMPLANT
BLADE SURG 15 STRL SS (BLADE) ×8
BNDG CMPR 9X4 STRL LF SNTH (GAUZE/BANDAGES/DRESSINGS) ×2
BNDG ESMARK 4X9 LF (GAUZE/BANDAGES/DRESSINGS) ×3 IMPLANT
BNDG GAUZE ELAST 4 BULKY (GAUZE/BANDAGES/DRESSINGS) ×4 IMPLANT
BRUSH SCRUB EZ PLAIN DRY (MISCELLANEOUS) ×4 IMPLANT
CHLORAPREP W/TINT 26ML (MISCELLANEOUS) ×4 IMPLANT
CORDS BIPOLAR (ELECTRODE) ×4 IMPLANT
COVER BACK TABLE 60X90IN (DRAPES) ×4 IMPLANT
COVER MAYO STAND STRL (DRAPES) ×4 IMPLANT
CUFF TOURNIQUET SINGLE 18IN (TOURNIQUET CUFF) ×3 IMPLANT
DECANTER SPIKE VIAL GLASS SM (MISCELLANEOUS) ×4 IMPLANT
DRAPE EXTREMITY T 121X128X90 (DRAPE) ×4 IMPLANT
DRAPE SURG 17X23 STRL (DRAPES) ×4 IMPLANT
GAUZE SPONGE 4X4 12PLY STRL (GAUZE/BANDAGES/DRESSINGS) ×4 IMPLANT
GAUZE XEROFORM 1X8 LF (GAUZE/BANDAGES/DRESSINGS) ×4 IMPLANT
GLOVE BIO SURGEON STRL SZ7.5 (GLOVE) ×4 IMPLANT
GLOVE BIOGEL PI IND STRL 7.0 (GLOVE) ×2 IMPLANT
GLOVE BIOGEL PI IND STRL 8 (GLOVE) ×2 IMPLANT
GLOVE BIOGEL PI INDICATOR 7.0 (GLOVE) ×4
GLOVE BIOGEL PI INDICATOR 8 (GLOVE) ×2
GLOVE ECLIPSE 6.5 STRL STRAW (GLOVE) ×3 IMPLANT
GOWN STRL REUS W/ TWL LRG LVL3 (GOWN DISPOSABLE) ×2 IMPLANT
GOWN STRL REUS W/TWL LRG LVL3 (GOWN DISPOSABLE) ×4
GOWN STRL REUS W/TWL XL LVL3 (GOWN DISPOSABLE) ×4 IMPLANT
NDL HYPO 25X1 1.5 SAFETY (NEEDLE) IMPLANT
NEEDLE HYPO 25X1 1.5 SAFETY (NEEDLE) ×4 IMPLANT
NS IRRIG 1000ML POUR BTL (IV SOLUTION) ×4 IMPLANT
PACK BASIN DAY SURGERY FS (CUSTOM PROCEDURE TRAY) ×4 IMPLANT
PAD CAST 3X4 CTTN HI CHSV (CAST SUPPLIES) ×2 IMPLANT
PADDING CAST ABS 4INX4YD NS (CAST SUPPLIES) ×2
PADDING CAST ABS COTTON 4X4 ST (CAST SUPPLIES) ×2 IMPLANT
PADDING CAST COTTON 3X4 STRL (CAST SUPPLIES) ×4
SLEEVE SCD COMPRESS KNEE MED (MISCELLANEOUS) ×3 IMPLANT
SPEAR EYE SURG WECK-CEL (MISCELLANEOUS) ×4 IMPLANT
STOCKINETTE 4X48 STRL (DRAPES) ×4 IMPLANT
SUT ETHILON 4 0 PS 2 18 (SUTURE) ×4 IMPLANT
SYR BULB 3OZ (MISCELLANEOUS) ×4 IMPLANT
SYR CONTROL 10ML LL (SYRINGE) ×3 IMPLANT
TOWEL OR 17X24 6PK STRL BLUE (TOWEL DISPOSABLE) ×8 IMPLANT
UNDERPAD 30X30 (UNDERPADS AND DIAPERS) ×4 IMPLANT

## 2016-04-20 NOTE — Anesthesia Procedure Notes (Signed)
Procedure Name: LMA Insertion Date/Time: 04/20/2016 2:01 PM Performed by: Burna CashONRAD, Cass Edinger C Pre-anesthesia Checklist: Patient identified, Emergency Drugs available, Suction available and Patient being monitored Patient Re-evaluated:Patient Re-evaluated prior to inductionOxygen Delivery Method: Circle system utilized Preoxygenation: Pre-oxygenation with 100% oxygen Intubation Type: IV induction Ventilation: Mask ventilation without difficulty LMA: LMA with gastric port inserted LMA Size: 4.0 and 5.0 Number of attempts: 2 Airway Equipment and Method: Bite block Placement Confirmation: positive ETCO2 Tube secured with: Tape Dental Injury: Teeth and Oropharynx as per pre-operative assessment

## 2016-04-20 NOTE — Transfer of Care (Signed)
Immediate Anesthesia Transfer of Care Note  Patient: Bruce Griffith  Procedure(s) Performed: Procedure(s): LEFT PALM WOUND EXPLORATION (Left)  Patient Location: PACU  Anesthesia Type:General  Level of Consciousness: awake, alert  and oriented  Airway & Oxygen Therapy: Patient Spontanous Breathing and Patient connected to face mask oxygen  Post-op Assessment: Report given to RN and Post -op Vital signs reviewed and stable  Post vital signs: Reviewed and stable  Last Vitals:  Vitals:   04/20/16 1156 04/20/16 1450  BP: 127/67   Pulse: 68 67  Resp: 20 11  Temp: 36.8 C     Last Pain:  Vitals:   04/20/16 1156  TempSrc: Oral  PainSc: 2       Patients Stated Pain Goal: 2 (04/20/16 1156)  Complications: No apparent anesthesia complications

## 2016-04-20 NOTE — Discharge Instructions (Addendum)

## 2016-04-20 NOTE — H&P (Signed)
Bruce Griffith is an 26 y.o. male.   Chief Complaint: left hand laceration HPI: 26 yo rhd male states he lacerated left palm while trying to cut a piece of pvc pipe last week.  Seen in ED where knife was removed.  Followed up in office.  He wishes to have operative exploration of the wound with repair of tendon/artery/nerve as necessary.  Allergies:  Allergies  Allergen Reactions  . Ceclor [Cefaclor] Rash  . Septra [Sulfamethoxazole-Trimethoprim] Rash    Past Medical History:  Diagnosis Date  . Laceration of left palm 04/14/2016    Past Surgical History:  Procedure Laterality Date  . NO PAST SURGERIES      Family History: History reviewed. No pertinent family history.  Social History:   reports that he has never smoked. He uses smokeless tobacco. He reports that he does not drink alcohol or use drugs.  Medications: Medications Prior to Admission  Medication Sig Dispense Refill  . acetaminophen (TYLENOL) 500 MG tablet Take 500 mg by mouth every 6 (six) hours as needed for mild pain or moderate pain.    . cephALEXin (KEFLEX) 500 MG capsule Take 1 capsule (500 mg total) by mouth 2 (two) times daily. 14 capsule 0    No results found for this or any previous visit (from the past 48 hour(s)).  No results found.   A comprehensive review of systems was negative.  Blood pressure 127/67, pulse 68, temperature 98.2 F (36.8 C), temperature source Oral, resp. rate 20, height 6' (1.829 m), weight 122.5 kg (270 lb), SpO2 99 %.  General appearance: alert, cooperative and appears stated age Head: Normocephalic, without obvious abnormality, atraumatic Neck: supple, symmetrical, trachea midline Resp: clear to auscultation bilaterally Cardio: regular rate and rhythm GI: non-tender Extremities: Intact sensation and capillary refill all digits.  +epl/fpl/io.  Laceration in ulnar palm left hand. Pulses: 2+ and symmetric Skin: Skin color, texture, turgor normal. No rashes or  lesions Neurologic: Grossly normal Incision/Wound:as above  Assessment/Plan Left palm laceration.  Recommend operative exploration with repair of tendon/artery/nerve as necessary.  Risks, benefits, and alternatives of surgery were discussed and the patient agrees with the plan of care.   Krithika Tome R 04/20/2016, 1:15 PM

## 2016-04-20 NOTE — Anesthesia Postprocedure Evaluation (Addendum)
Anesthesia Post Note  Patient: Bruce Griffith  Procedure(s) Performed: Procedure(s) (LRB): LEFT PALM WOUND EXPLORATION (Left)  Patient location during evaluation: PACU Anesthesia Type: General Level of consciousness: awake and alert, patient cooperative and oriented Pain management: pain level controlled Vital Signs Assessment: post-procedure vital signs reviewed and stable Respiratory status: spontaneous breathing, nonlabored ventilation and respiratory function stable Cardiovascular status: blood pressure returned to baseline and stable Postop Assessment: no signs of nausea or vomiting Anesthetic complications: no       Last Vitals:  Vitals:   04/20/16 1515 04/20/16 1542  BP: 127/66 130/80  Pulse: 66 71  Resp: 18 16  Temp:  36.6 C    Last Pain:  Vitals:   04/20/16 1542  TempSrc:   PainSc: 3                  Darnise Montag,E. Alfio Loescher

## 2016-04-20 NOTE — Op Note (Signed)
[id number] 

## 2016-04-20 NOTE — Brief Op Note (Signed)
04/20/2016  2:44 PM  PATIENT:  Bruce Griffith  26 y.o. male  PRE-OPERATIVE DIAGNOSIS:  LEFT PALM LACERATION  POST-OPERATIVE DIAGNOSIS:  LEFT PALM LACERATION  PROCEDURE:  Procedure(s): LEFT PALM WOUND EXPLORATION (Left)  SURGEON:  Surgeon(s) and Role:    * Betha LoaKevin Boysie Bonebrake, MD - Primary  PHYSICIAN ASSISTANT:   ASSISTANTS: none   ANESTHESIA:   general  EBL:  No intake/output data recorded.  BLOOD ADMINISTERED:none  DRAINS: none   LOCAL MEDICATIONS USED:  MARCAINE     SPECIMEN:  No Specimen  DISPOSITION OF SPECIMEN:  N/A  COUNTS:  YES  TOURNIQUET:   Total Tourniquet Time Documented: Upper Arm (Left) - 27 minutes Total: Upper Arm (Left) - 27 minutes   DICTATION: .Other Dictation: Dictation Number [id number]  PLAN OF CARE: Discharge to home after PACU  PATIENT DISPOSITION:  PACU - hemodynamically stable.

## 2016-04-20 NOTE — Anesthesia Preprocedure Evaluation (Addendum)
Anesthesia Evaluation  Patient identified by MRN, date of birth, ID band Patient awake    Reviewed: Allergy & Precautions, NPO status , Patient's Chart, lab work & pertinent test results  Airway Mallampati: II  TM Distance: >3 FB Neck ROM: Full    Dental no notable dental hx.    Pulmonary neg pulmonary ROS,    Pulmonary exam normal breath sounds clear to auscultation       Cardiovascular negative cardio ROS Normal cardiovascular exam Rhythm:Regular Rate:Normal     Neuro/Psych negative neurological ROS  negative psych ROS   GI/Hepatic negative GI ROS, Neg liver ROS,   Endo/Other  negative endocrine ROS  Renal/GU negative Renal ROS     Musculoskeletal negative musculoskeletal ROS (+)   Abdominal   Peds  Hematology negative hematology ROS (+)   Anesthesia Other Findings   Reproductive/Obstetrics negative OB ROS                             Anesthesia Physical Anesthesia Plan  ASA: II  Anesthesia Plan: General   Post-op Pain Management:    Induction: Intravenous  Airway Management Planned: LMA  Additional Equipment:   Intra-op Plan:   Post-operative Plan: Extubation in OR  Informed Consent: I have reviewed the patients History and Physical, chart, labs and discussed the procedure including the risks, benefits and alternatives for the proposed anesthesia with the patient or authorized representative who has indicated his/her understanding and acceptance.   Dental advisory given  Plan Discussed with: CRNA  Anesthesia Plan Comments:         Anesthesia Quick Evaluation  

## 2016-04-21 ENCOUNTER — Encounter (HOSPITAL_BASED_OUTPATIENT_CLINIC_OR_DEPARTMENT_OTHER): Payer: Self-pay | Admitting: Orthopedic Surgery

## 2016-04-21 NOTE — Op Note (Signed)
NAMHerschel [place]:  Carrero, Zair               ACCOUNT NO.:  [id number]  MEDICAL RECORD NO.:  [id number]  LOCATION:  A06C                         FACILITY:  [facility]  PHYSICIAN:  Betha LoaKevin Zakyah Yanes, MD        DATE OF BIRTH:  04-24-1990  DATE OF PROCEDURE:  04/20/2016 DATE OF DISCHARGE:                              OPERATIVE REPORT   PREOPERATIVE DIAGNOSIS:  Left palm laceration.  POSTOPERATIVE DIAGNOSIS:  Left palm laceration.  PROCEDURE:   1. Left palm exploration of wound including common digital nerve and artery to ring and small finger; flexor tendons to ring finger 2. Left palm exploration flexor tendons to small finger; and ulnar digital nerve and artery to small finger via separate incision.  SURGEON:  Betha LoaKevin Jamont Mellin, MD.  ASSISTANT:  None.  ANESTHESIA:  General.  IV FLUIDS:  Per anesthesia flow sheet.  ESTIMATED BLOOD LOSS:  Minimal.  COMPLICATIONS:  None.  SPECIMENS:  None.  TOURNIQUET TIME:  27 minutes.  DISPOSITION:  Stable to PACU.  INDICATIONS:  Bruce Griffith is a 26 year old male who sustained laceration to the left palm while he was trying to cut a PVC pipe at the end of last week.  He was seen in the emergency department where the knife was removed.  He followed up in the office.  I recommended operative exploration of the wound.  Risks, benefits and alternatives of surgery were discussed including the risk of blood loss; infection; damage to nerves, vessels, tendons, ligaments, bone; failure of surgery; need for additional surgery; complications with wound healing; continued pain. He voiced understanding of these risks and elected to proceed.  OPERATIVE COURSE:  After being identified preoperatively by myself, the patient and I agreed upon procedure and site of procedure.  Surgical site marked.  Risks, benefits, and alternatives of surgery were reviewed and he wished to proceed.  Surgical consent had been signed.  He was given IV Ancef as preoperative antibiotic prophylaxis.   He was transported to the operating room and placed on the operating room table in supine position with the left upper extremity on arm board.  General anesthesia was induced by anesthesiologist.  Left upper extremity was prepped and draped in normal sterile orthopedic fashion.  A surgical pause was performed between surgeons, anesthesia, and operating room staff and all were in agreement as to the patient, procedure and site of procedure.  Tourniquet at the proximal aspect of the extremity was inflated to 250 mmHg after exsanguination of the limb with an Esmarch bandage.  The suture had been removed.  The wound over the ring finger metacarpal volarly was extended both proximally and distally.  This was carried into subcutaneous tissues by spreading technique.  The FDP and FDS tendons were identified.  They appeared intact.  The sheath was not violated.  The common digital nerve and artery going to the ring and small finger webspace was identified.  Both were intact.  The laceration appeared to go over top of these toward the ulnar side of the hand.  An additional incision was made over the small finger metacarpal volarly. This was carried into subcutaneous tissues again by spreading technique. Bipolar electrocautery was used to obtain hemostasis.  The FDP and FDS tendon to the small finger were identified and were intact.  The ulnar digital nerve and artery to the small finger were identified and were intact.  They were explored over the length of the zone of injury.  The laceration appeared to go underneath the small finger flexor tendons and neurovascular bundle on the ulnar side.  The wound was copiously irrigated with sterile saline.  They were then closed with 4-0 nylon in a horizontal mattress fashion.  They were injected with 10 mL of 0.25% plain Marcaine to aid in postoperative analgesia.  They were then dressed with sterile Xeroform, 4x4s, and wrapped with a Coban  dressing lightly.  Tourniquet was deflated at 27 minutes.  Fingertips were pink with brisk capillary refill after deflation of the tourniquet. Operative drapes were broken down and the patient was awoken from anesthesia safely.  He was transferred back to stretcher and taken to PACU in stable condition.  I will see him back in the office in 1 week for postoperative followup.  I will give him Norco 5/325 one to two p.o. q.6 hours p.r.n. pain, dispensed #20.     Betha Loa, MD     KK/MEDQ  D:  04/20/2016  T:  04/21/2016  Job:  [id number]

## 2016-05-29 ENCOUNTER — Emergency Department (HOSPITAL_COMMUNITY)
Admission: EM | Admit: 2016-05-29 | Discharge: 2016-05-29 | Disposition: A | Discharge status: Home / Self Care | Payer: Self-pay | Attending: Emergency Medicine | Admitting: Emergency Medicine

## 2016-05-29 ENCOUNTER — Encounter (HOSPITAL_COMMUNITY): Payer: Self-pay | Admitting: Emergency Medicine

## 2016-05-29 ENCOUNTER — Emergency Department (HOSPITAL_COMMUNITY): Payer: Self-pay

## 2016-05-29 DIAGNOSIS — J40 Bronchitis, not specified as acute or chronic: Secondary | ICD-10-CM | POA: Insufficient documentation

## 2016-05-29 DIAGNOSIS — F17220 Nicotine dependence, chewing tobacco, uncomplicated: Secondary | ICD-10-CM | POA: Insufficient documentation

## 2016-05-29 MED ORDER — ALBUTEROL SULFATE HFA 108 (90 BASE) MCG/ACT IN AERS
2.0000 | INHALATION_SPRAY | Freq: Once | RESPIRATORY_TRACT | Status: AC
  Administered 2016-05-29: 2 via RESPIRATORY_TRACT
  Filled 2016-05-29: qty 6.7

## 2016-05-29 MED ORDER — AZITHROMYCIN 250 MG PO TABS: ORAL_TABLET | ORAL | 0 refills | Status: DC

## 2016-05-29 MED ORDER — PREDNISONE 20 MG PO TABS: 40.0000 mg | ORAL_TABLET | Freq: Every day | ORAL | 0 refills | Status: DC

## 2016-05-29 NOTE — Discharge Instructions (Signed)
Tylenol every 4 hrs if needed for fever.  2 puffs of the inhaler 4 times a day for 2-3 days, then as needed.  Plenty of fluids.  Return here for any worsening symptoms

## 2016-05-29 NOTE — ED Notes (Signed)
RT called for inhaler.  

## 2016-05-29 NOTE — ED Triage Notes (Signed)
Pt c/o nasal congestion/drainage, sore throat, headache with coughing and productive yellow sputum cough unrelieved by OTC medications x2 weeks.

## 2016-05-30 NOTE — ED Provider Notes (Signed)
[facility]-EMERGENCY DEPT Provider Note   CSN: [id number] Arrival date & time: 05/29/16  1152     History   Chief Complaint Chief Complaint  Patient presents with  . Cough    HPI Dineen KidDustin J Marchetta is a 26 y.o. male.  HPI  Dineen KidDustin J Stanwood is a 26 y.o. male who presents to the Emergency Department complaining of nasal congestion, cough, sore throat and intermittent frontal headache for 2 weeks.  He states the cough is productive at times and associated with occasional tightness of his upper chest.  He also states the headache is associated with excessive coughing.  He has been taking OTC cold and cough medications without relief.  He denies fever, vomiting, shortness of breath and abdominal pain.   Past Medical History:  Diagnosis Date  . Laceration of left palm 04/14/2016    Patient Active Problem List   Diagnosis Date Noted  . Psoriasis     Past Surgical History:  Procedure Laterality Date  . NO PAST SURGERIES    . WOUND EXPLORATION Left 04/20/2016   Procedure: LEFT PALM WOUND EXPLORATION;  Surgeon: Betha LoaKevin Kuzma, MD;  Location: [facility];  Service: Orthopedics;  Laterality: Left;       Home Medications    Prior to Admission medications   Medication Sig Start Date End Date Taking? Authorizing Provider  azithromycin (ZITHROMAX) 250 MG tablet Take first 2 tablets together, then 1 every day until finished. 05/29/16   Abeeha Twist, PA-C  HYDROcodone-acetaminophen (NORCO) 5-325 MG tablet 1-2 tabs po q6 hours prn pain 04/20/16   Betha LoaKevin Kuzma, MD  predniSONE (DELTASONE) 20 MG tablet Take 2 tablets (40 mg total) by mouth daily. For 5 days 05/29/16   Pauline Ausammy Rhiannan Kievit, PA-C    Family History History reviewed. No pertinent family history.  Social History Social History  Substance Use Topics  . Smoking status: Never Smoker  . Smokeless tobacco: Current User    Types: Chew  . Alcohol use No     Allergies   Ceclor [cefaclor] and Septra  [sulfamethoxazole-trimethoprim]   Review of Systems Review of Systems  Constitutional: Negative for activity change, appetite change, chills and fever.  HENT: Positive for congestion, rhinorrhea and sore throat. Negative for facial swelling and trouble swallowing.   Eyes: Negative for visual disturbance.  Respiratory: Positive for cough and chest tightness. Negative for shortness of breath, wheezing and stridor.   Gastrointestinal: Negative for abdominal pain, nausea and vomiting.  Genitourinary: Negative for dysuria.  Musculoskeletal: Negative for neck pain and neck stiffness.  Skin: Negative.   Neurological: Positive for headaches. Negative for dizziness, weakness and numbness.  Hematological: Negative for adenopathy.  Psychiatric/Behavioral: Negative for confusion.  All other systems reviewed and are negative.    Physical Exam Updated Vital Signs BP 137/81 (BP Location: Right Arm)   Pulse 88   Temp 98.5 F (36.9 C) (Oral)   Resp 18   Ht 6' (1.829 m)   Wt 122.5 kg   SpO2 97%   BMI 36.62 kg/m   Physical Exam  Constitutional: He is oriented to person, place, and time. He appears well-developed and well-nourished. No distress.  HENT:  Head: Normocephalic and atraumatic.  Right Ear: Tympanic membrane and ear canal normal.  Left Ear: Tympanic membrane and ear canal normal.  Mouth/Throat: Uvula is midline, oropharynx is clear and moist and mucous membranes are normal. No oropharyngeal exudate.  Eyes: EOM are normal. Pupils are equal, round, and reactive to light.  Neck: Normal range of  motion, full passive range of motion without pain and phonation normal. Neck supple.  Cardiovascular: Normal rate, regular rhythm, normal heart sounds and intact distal pulses.   No murmur heard. Pulmonary/Chest: Effort normal. No stridor. No respiratory distress. He has no rales. He exhibits no tenderness.  Coarse lungs sounds bilaterally, no rales or wheezing  Abdominal: Soft. He exhibits no  distension. There is no tenderness.  Musculoskeletal: Normal range of motion. He exhibits no edema.  Lymphadenopathy:    He has no cervical adenopathy.  Neurological: He is alert and oriented to person, place, and time. He exhibits normal muscle tone. Coordination normal.  Skin: Skin is warm and dry. No rash noted.  Nursing note and vitals reviewed.    ED Treatments / Results  Labs (all labs ordered are listed, but only abnormal results are displayed) Labs Reviewed - No data to display  EKG  EKG Interpretation None       Radiology Dg Chest 2 View  Result Date: 05/29/2016 CLINICAL DATA:  Cough and chest pain.  Fever. EXAM: CHEST  2 VIEW COMPARISON:  January 04, 2006 FINDINGS: The lungs are clear. The heart size and pulmonary vascularity are normal. No adenopathy. No pneumothorax. No bone lesions. IMPRESSION: No edema or consolidation. Electronically Signed   By: Bretta Bang III M.D.   On: 05/29/2016 12:28    Procedures Procedures (including critical care time)  Medications Ordered in ED Medications  albuterol (PROVENTIL HFA;VENTOLIN HFA) 108 (90 Base) MCG/ACT inhaler 2 puff (2 puffs Inhalation Given 05/29/16 1236)     Initial Impression / Assessment and Plan / ED Course  I have reviewed the triage vital signs and the nursing notes.  Pertinent labs & imaging results that were available during my care of the patient were reviewed by me and considered in my medical decision making (see chart for details).     XR reassuring, on recheck,  lung sounds improved after two puffs of the albuterol MDI.  Vitals stable, pt is non-toxic appearing.  Albuterol MDI was dispensed for home use.  rx for Zithromax and prednisone.  Return precautions discussed.    Final Clinical Impressions(s) / ED Diagnoses   Final diagnoses:  Bronchitis    New Prescriptions Discharge Medication List as of 05/29/2016 12:49 PM    START taking these medications   Details  azithromycin (ZITHROMAX) 250  MG tablet Take first 2 tablets together, then 1 every day until finished., Print    predniSONE (DELTASONE) 20 MG tablet Take 2 tablets (40 mg total) by mouth daily. For 5 days, Starting Fri 05/29/2016, Print         Sharnette Kitamura [place], [place] 05/30/16 1610    Eber Hong, MD 05/31/16 [phone]

## 2016-09-24 ENCOUNTER — Encounter (HOSPITAL_COMMUNITY): Payer: Self-pay

## 2016-09-24 DIAGNOSIS — J069 Acute upper respiratory infection, unspecified: Secondary | ICD-10-CM | POA: Insufficient documentation

## 2016-09-24 DIAGNOSIS — F17220 Nicotine dependence, chewing tobacco, uncomplicated: Secondary | ICD-10-CM | POA: Insufficient documentation

## 2016-09-24 NOTE — ED Triage Notes (Signed)
Patient states that he has a sore throat that started yesterday.  Denies fevers, but states he has episodes of being hot and cold.

## 2016-09-25 ENCOUNTER — Emergency Department (HOSPITAL_COMMUNITY)
Admission: EM | Admit: 2016-09-25 | Discharge: 2016-09-25 | Disposition: A | Discharge status: Home / Self Care | Payer: Self-pay | Attending: Emergency Medicine | Admitting: Emergency Medicine

## 2016-09-25 DIAGNOSIS — J029 Acute pharyngitis, unspecified: Secondary | ICD-10-CM

## 2016-09-25 DIAGNOSIS — J069 Acute upper respiratory infection, unspecified: Secondary | ICD-10-CM

## 2016-09-25 MED ORDER — ONDANSETRON HCL 4 MG PO TABS
4.0000 mg | ORAL_TABLET | Freq: Once | ORAL | Status: AC
  Administered 2016-09-25: 4 mg via ORAL
  Filled 2016-09-25: qty 1

## 2016-09-25 MED ORDER — IBUPROFEN 600 MG PO TABS: 600.0000 mg | ORAL_TABLET | Freq: Four times a day (QID) | ORAL | 0 refills | Status: DC

## 2016-09-25 MED ORDER — CLINDAMYCIN HCL 150 MG PO CAPS: ORAL_CAPSULE | ORAL | 0 refills | Status: DC

## 2016-09-25 MED ORDER — CLINDAMYCIN HCL 150 MG PO CAPS
300.0000 mg | ORAL_CAPSULE | Freq: Once | ORAL | Status: AC
  Administered 2016-09-25: 300 mg via ORAL
  Filled 2016-09-25: qty 2

## 2016-09-25 MED ORDER — IBUPROFEN 800 MG PO TABS
800.0000 mg | ORAL_TABLET | Freq: Once | ORAL | Status: AC
  Administered 2016-09-25: 800 mg via ORAL
  Filled 2016-09-25: qty 1

## 2016-09-25 NOTE — Discharge Instructions (Signed)
Please use salt water gargles 3 or 4 times daily. Please wash hands frequently. Increase fluids. Use Tylenol, and/or ibuprofen for some discomfort and or fever. Use clindamycin 2 times daily with food. Please see your primary physician or return to the emergency department if not improving.

## 2016-09-25 NOTE — ED Provider Notes (Signed)
[facility]-EMER[facility]Y DEPT Provider Note   CSN: [id number] Arrival date & time: 09/24/16  2232     History   Chief Complaint Chief Complaint  Patient presents with  . Sore Throat    HPI Bruce Griffith is a 26 y.o. male.  Patient is a 26 year old male who presents to the emergency department with a complaint of sore throat.  The patient states she started feeling bad on June 27. He complains of generally not feeling well, having swelling of his tonsils and he today noted some "white stuff" on the tonsils. He has some headache and some generalized body aches. He's had some chills, but has not measured a temperature elevation. 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 He is not taken anything for his discomfort.      Past Medical History:  Diagnosis Date  . Laceration of left palm 04/14/2016    Patient Active Problem List   Diagnosis Date Noted  . Psoriasis     Past Surgical History:  Procedure Laterality Date  . HAND SURGERY Left   . NO PAST SURGERIES    . WOUND EXPLORATION Left 04/20/2016   Procedure: LEFT PALM WOUND EXPLORATION;  Surgeon: Betha Loa, MD;  Location: [facility];  Service: Orthopedics;  Laterality: Left;       Home Medications    Prior to Admission medications   Medication Sig Start Date End Date Taking? Authorizing Provider  azithromycin (ZITHROMAX) 250 MG tablet Take first 2 tablets together, then 1 every day until finished. 05/29/16   Triplett, Tammy, PA-C  HYDROcodone-acetaminophen (NORCO) 5-325 MG tablet 1-2 tabs po q6 hours prn pain 04/20/16   Betha Loa, MD  predniSONE (DELTASONE) 20 MG tablet Take 2 tablets (40 mg total) by mouth daily. For 5 days 05/29/16   Pauline Aus, PA-C    Family History No family history on file.  Social History Social History  Substance Use Topics  . Smoking status: Never Smoker  . Smokeless tobacco: Current User    Types:  Chew  . Alcohol use No     Allergies   Ceclor [cefaclor] and Septra [sulfamethoxazole-trimethoprim]   Review of Systems Review of Systems  Constitutional: Positive for appetite change, chills and fatigue. Negative for activity change.  HENT: Positive for sore throat. Negative for congestion, ear discharge, ear pain, facial swelling, nosebleeds, rhinorrhea, sneezing and tinnitus.   Eyes: Negative for photophobia, pain and discharge.  Respiratory: Negative for cough, choking, shortness of breath and wheezing.   Cardiovascular: Negative for chest pain, palpitations and leg swelling.  Gastrointestinal: Negative for abdominal pain, blood in stool, constipation, diarrhea, nausea and vomiting.  Genitourinary: Negative for difficulty urinating, dysuria, flank pain, frequency and hematuria.  Musculoskeletal: Negative for back pain, gait problem, myalgias and neck pain.  Skin: Negative for color change, rash and wound.  Neurological: Negative for dizziness, seizures, syncope, facial asymmetry, speech difficulty, weakness and numbness.  Hematological: Negative for adenopathy. Does not bruise/bleed easily.  Psychiatric/Behavioral: Negative for agitation, confusion, hallucinations, self-injury and suicidal ideas. The patient is not nervous/anxious.      Physical Exam Updated Vital Signs BP 124/77 (BP Location: Right Arm)   Pulse 63   Temp 98.3 F (36.8 C) (Oral)   Resp 18   Ht 6' (1.829 m)   Wt 127 kg (280 lb)   SpO2 98%   BMI 37.97 kg/m   Physical Exam  Constitutional: Vital  signs are normal. He appears well-developed and well-nourished. He is active.  HENT:  Head: Normocephalic and atraumatic.  Right Ear: Tympanic membrane, external ear and ear canal normal.  Left Ear: Tympanic membrane, external ear and ear canal normal.  Nose: Nose normal.  Mouth/Throat: Uvula is midline, oropharynx is clear and moist and mucous membranes are normal.  There is increased redness of the posterior  pharynx. There is exudate on the tonsil areas. The airway is patent. The uvula is swollen, but in the midline.  Eyes: Conjunctivae, EOM and lids are normal. Pupils are equal, round, and reactive to light.  Neck: Trachea normal, normal range of motion and phonation normal. Neck supple. Carotid bruit is not present.  Cardiovascular: Normal rate, regular rhythm and normal pulses.   Pulmonary/Chest: Effort normal and breath sounds normal.  Abdominal: Soft. Normal appearance and bowel sounds are normal.  Lymphadenopathy:       Head (right side): No submental, no preauricular and no posterior auricular adenopathy present.       Head (left side): No submental, no preauricular and no posterior auricular adenopathy present.    He has no cervical adenopathy.  Neurological: He is alert. He has normal strength. No cranial nerve deficit or sensory deficit. GCS eye subscore is 4. GCS verbal subscore is 5. GCS motor subscore is 6.  Skin: Skin is warm and dry.  Psychiatric: His speech is normal.     ED Treatments / Results  Labs (all labs ordered are listed, but only abnormal results are displayed) Labs Reviewed - No data to display  EKG  EKG Interpretation None       Radiology No results found.  Procedures Procedures (including critical care time)  Medications Ordered in ED Medications - No data to display   Initial Impression / Assessment and Plan / ED Course  I have reviewed the triage vital signs and the nursing notes.  Pertinent labs & imaging results that were available during my care of the patient were reviewed by me and considered in my medical decision making (see chart for details).       Final Clinical Impressions(s) / ED Diagnoses MDM Vital signs reviewed. The examination favors pharyngitis and upper respiratory infection. Patient will be treated with clindamycin, salt water gargles, ibuprofen. Patient is been advised to wash hands frequently and to increase fluids. The  patient is to follow with his primary physician or return to the emergency department if not improving.    Final diagnoses:  Pharyngitis, unspecified etiology  Upper respiratory tract infection, unspecified type    New Prescriptions New Prescriptions   No medications on file     Jacere Pangborn, Cordelia Poche-C 06/Ivery Quale29/18 0049    Glynn Octaveancour, Stephen, MD 09/25/16 [phone]

## 2016-10-12 NOTE — Addendum Note (Signed)
Addendum  created 10/12/16 1701 by Othman Masur, MD   Delete clinical note, Sign clinical note    

## 2016-10-12 NOTE — Anesthesia Postprocedure Evaluation (Deleted)
Anesthesia Post Note  Patient: Bruce Griffith  Procedure(s) Performed: Procedure(s) (LRB): LEFT PALM WOUND EXPLORATION (Left)     Anesthesia Post Evaluation  Last Vitals:  Vitals:   04/20/16 1515 04/20/16 1542  BP: 127/66 130/80  Pulse: 66 71  Resp: 18 16  Temp:  36.6 C    Last Pain:  Vitals:   04/21/16 1012  TempSrc:   PainSc: 4                  Fadil Macmaster,E. Anuhea Gassner

## 2016-10-12 NOTE — Addendum Note (Signed)
Addendum  created 10/12/16 1453 by Jairo BenJackson, Aidynn Krenn, MD   Sign clinical note

## 2016-10-31 ENCOUNTER — Emergency Department (HOSPITAL_COMMUNITY): Payer: No Typology Code available for payment source

## 2016-10-31 ENCOUNTER — Emergency Department (HOSPITAL_COMMUNITY)
Admission: EM | Admit: 2016-10-31 | Discharge: 2016-10-31 | Disposition: A | Discharge status: Home / Self Care | Payer: No Typology Code available for payment source | Attending: Emergency Medicine | Admitting: Emergency Medicine

## 2016-10-31 ENCOUNTER — Encounter (HOSPITAL_COMMUNITY): Payer: Self-pay | Admitting: Cardiology

## 2016-10-31 DIAGNOSIS — F17220 Nicotine dependence, chewing tobacco, uncomplicated: Secondary | ICD-10-CM | POA: Insufficient documentation

## 2016-10-31 DIAGNOSIS — R531 Weakness: Secondary | ICD-10-CM | POA: Diagnosis present

## 2016-10-31 DIAGNOSIS — J069 Acute upper respiratory infection, unspecified: Secondary | ICD-10-CM

## 2016-10-31 DIAGNOSIS — Z79899 Other long term (current) drug therapy: Secondary | ICD-10-CM | POA: Insufficient documentation

## 2016-10-31 LAB — COMPREHENSIVE METABOLIC PANEL
ALBUMIN: 3.7 g/dL (ref 3.5–5.0)
ALT: 25 U/L (ref 17–63)
AST: 18 U/L (ref 15–41)
Alkaline Phosphatase: 77 U/L (ref 38–126)
Anion gap: 9 (ref 5–15)
BILIRUBIN TOTAL: 0.9 mg/dL (ref 0.3–1.2)
BUN: 11 mg/dL (ref 6–20)
CO2: 27 mmol/L (ref 22–32)
CREATININE: 0.67 mg/dL (ref 0.61–1.24)
Calcium: 9.3 mg/dL (ref 8.9–10.3)
Chloride: 106 mmol/L (ref 101–111)
GFR calc Af Amer: >60 mL/min (ref >60)
GFR calc non Af Amer: >60 mL/min (ref >60)
GLUCOSE: 92 mg/dL (ref 65–99)
POTASSIUM: 4.6 mmol/L (ref 3.5–5.1)
Sodium: 142 mmol/L (ref 135–145)
TOTAL PROTEIN: 7.6 g/dL (ref 6.5–8.1)

## 2016-10-31 LAB — CBC WITH DIFFERENTIAL/PLATELET
BASOS ABS: 0 10*3/uL (ref 0.0–0.1)
BASOS PCT: 0 %
Eosinophils Absolute: 0.2 10*3/uL (ref 0.0–0.7)
Eosinophils Relative: 2 %
HEMATOCRIT: 47.3 % (ref 39.0–52.0)
HEMOGLOBIN: 15.5 g/dL (ref 13.0–17.0)
LYMPHS PCT: 25 %
Lymphs Abs: 2.7 10*3/uL (ref 0.7–4.0)
MCH: 27.1 pg (ref 26.0–34.0)
MCHC: 32.8 g/dL (ref 30.0–36.0)
MCV: 82.7 fL (ref 78.0–100.0)
MONO ABS: 1 10*3/uL (ref 0.1–1.0)
Monocytes Relative: 9 %
NEUTROS ABS: 7 10*3/uL (ref 1.7–7.7)
NEUTROS PCT: 64 %
Platelets: 232 10*3/uL (ref 150–400)
RBC: 5.72 MIL/uL (ref 4.22–5.81)
RDW: 13.4 % (ref 11.5–15.5)
WBC: 10.8 10*3/uL — AB (ref 4.0–10.5)

## 2016-10-31 LAB — URINALYSIS, ROUTINE W REFLEX MICROSCOPIC
BILIRUBIN URINE: NEGATIVE
GLUCOSE, UA: NEGATIVE mg/dL
HGB URINE DIPSTICK: NEGATIVE
Ketones, ur: NEGATIVE mg/dL
Leukocytes, UA: NEGATIVE
Nitrite: NEGATIVE
Protein, ur: NEGATIVE mg/dL
SPECIFIC GRAVITY, URINE: 1.018 (ref 1.005–1.030)
pH: 6 (ref 5.0–8.0)

## 2016-10-31 NOTE — ED Provider Notes (Signed)
[facility]-EMERGENCY DEPT Provider Note   CSN: [id number] Arrival date & time: 10/31/16  1105     History   Chief Complaint Chief Complaint  Patient presents with  . Weakness    HPI Dineen KidDustin J Griffith is a 26 y.o. male.  Patient is a 26 year old male who presents to the emergency department with a complaint of weakness, stuffy nose, and being generally fatigued.  The patient states that the symptoms started on Thursday, August 2. He noted some stuffiness of his nose, some postnasal drip, headache, fatigue, feeling weak, and sore over. The patient also noted that he took a tick off of his body on Thursday as well. The patient has not had any temperature elevation, there's been no vomiting, he's had one episode of diarrhea. It is of note that the patient had blood tinged mucus that he coughed up 1. He has not been out of the country recently. He states the best of his knowledge he is not had any sick contacts. He presents to the emergency department at this time for evaluation of the symptoms.      Past Medical History:  Diagnosis Date  . Laceration of left palm 04/14/2016    Patient Active Problem List   Diagnosis Date Noted  . Psoriasis     Past Surgical History:  Procedure Laterality Date  . HAND SURGERY Left   . NO PAST SURGERIES    . WOUND EXPLORATION Left 04/20/2016   Procedure: LEFT PALM WOUND EXPLORATION;  Surgeon: Betha LoaKevin Kuzma, MD;  Location: [facility];  Service: Orthopedics;  Laterality: Left;       Home Medications    Prior to Admission medications   Medication Sig Start Date End Date Taking? Authorizing Provider  acetaminophen (TYLENOL) 500 MG tablet Take 2 tablets by mouth every 6 (six) hours.   Yes [provider]  ibuprofen (ADVIL,MOTRIN) 600 MG tablet Take 1 tablet (600 mg total) by mouth 4 (four) times daily. 09/25/16  Yes Ivery QualeBryant, Cassy Sprowl, PA-C  Phenyleph-Doxylamine-DM-APAP (COLD/FLU RELIEF DAY/NIGHT PO) Take 2 capsules by mouth every 4  (four) hours as needed (cold).   Yes [provider]    Family History History reviewed. No pertinent family history.  Social History Social History  Substance Use Topics  . Smoking status: Never Smoker  . Smokeless tobacco: Current User    Types: Chew  . Alcohol use No     Allergies   Ceclor [cefaclor] and Septra [sulfamethoxazole-trimethoprim]   Review of Systems Review of Systems  Constitutional: Positive for fatigue. Negative for activity change.       All ROS Neg except as noted in HPI  HENT: Positive for postnasal drip. Negative for nosebleeds.   Eyes: Negative for photophobia and discharge.  Respiratory: Negative for cough, shortness of breath and wheezing.   Cardiovascular: Negative for chest pain and palpitations.  Gastrointestinal: Negative for abdominal pain and blood in stool.  Genitourinary: Negative for dysuria, frequency and hematuria.  Musculoskeletal: Positive for myalgias. Negative for arthralgias, back pain and neck pain.  Skin: Negative.   Neurological: Positive for headaches. Negative for dizziness, seizures and speech difficulty.  Psychiatric/Behavioral: Negative for confusion and hallucinations.     Physical Exam Updated Vital Signs BP 111/76   Pulse (!) 59   Temp 98.1 F (36.7 C) (Oral)   Resp 16   Ht 6' (1.829 m)   Wt 124.7 kg (275 lb)   SpO2 97%   BMI 37.30 kg/m   Physical Exam  Constitutional: He  is oriented to person, place, and time. He appears well-developed and well-nourished.  Non-toxic appearance.  HENT:  Head: Normocephalic.  Right Ear: Tympanic membrane and external ear normal.  Left Ear: Tympanic membrane and external ear normal.  Mild nasal congestion present.  Eyes: Pupils are equal, round, and reactive to light. EOM and lids are normal.  Neck: Normal range of motion. Neck supple. Carotid bruit is not present.  Cardiovascular: Normal rate, regular rhythm, normal heart sounds, intact distal pulses and normal  pulses.  Exam reveals no gallop and no friction rub.   No murmur heard. Pulmonary/Chest: Breath sounds normal. No respiratory distress. He has no wheezes.  Abdominal: Soft. Bowel sounds are normal. There is no tenderness. There is no guarding.  Musculoskeletal: Normal range of motion.  Lymphadenopathy:       Head (right side): No submandibular adenopathy present.       Head (left side): No submandibular adenopathy present.    He has no cervical adenopathy.  Neurological: He is alert and oriented to person, place, and time. He has normal strength. No cranial nerve deficit or sensory deficit. Coordination normal.  Skin: Skin is warm and dry.  Psychiatric: He has a normal mood and affect. His speech is normal.  Nursing note and vitals reviewed.    ED Treatments / Results  Labs (all labs ordered are listed, but only abnormal results are displayed) Labs Reviewed  CBC WITH DIFFERENTIAL/PLATELET - Abnormal; Notable for the following:       Result Value   WBC 10.8 (*)    All other components within normal limits  COMPREHENSIVE METABOLIC PANEL  URINALYSIS, ROUTINE W REFLEX MICROSCOPIC    EKG  EKG Interpretation None       Radiology Dg Chest 2 View  Result Date: 10/31/2016 CLINICAL DATA:  Weakness, fatigue, and myalgias for 3 days. Recent tick bite. EXAM: CHEST  2 VIEW COMPARISON:  05/29/2016 FINDINGS: The heart size and mediastinal contours are within normal limits. Both lungs are clear. The visualized skeletal structures are unremarkable. IMPRESSION: No active cardiopulmonary disease. Electronically Signed   By: Myles RosenthalJohn  Griffith M.D.   On: 10/31/2016 13:32    Procedures Procedures (including critical care time)  Medications Ordered in ED Medications - No data to display   Initial Impression / Assessment and Plan / ED Course  I have reviewed the triage vital signs and the nursing notes.  Pertinent labs & imaging results that were available during my care of the patient were  reviewed by me and considered in my medical decision making (see chart for details).      Final Clinical Impressions(s) / ED Diagnoses MDM Vital signs within normal limits. The complete blood count shows the white blood cells to be slightly elevated at 10,800, otherwise normal. There is no shift to the left. The chest x-ray is negative for acute changes or problems.  UA wnl. Cmet wnl. Suspect viral illness. No unusual rash noted. No high fever reported. Pt reports taking a tick off his body, but not imbedded. No trips out of the country recently.  Pt to increase fluids and use tylenol or ilbuprofen for aching or soreness. Pt to follow up with PCP or return to the ED if any changes or problem.   Final diagnoses:  Upper respiratory tract infection, unspecified type    New Prescriptions New Prescriptions   No medications on file     Ivery QualeBryant, Savayah Waltrip, Cordelia Poche-C 11/02/16 [id number]    Griffith, Arlyss RepressJoshua G, MD 11/02/16 [phone]

## 2016-10-31 NOTE — ED Triage Notes (Signed)
Removed tick Wednesday.  Generalized weakness and soreness since Thursday.

## 2016-10-31 NOTE — ED Notes (Signed)
EMS MED necessity charted on wrong pt

## 2016-10-31 NOTE — Discharge Instructions (Signed)
Your vial signs are within normal limits. Your urine test is negative for infection or dehydration. Your blood test suggest viral illness. Your chest xray is negative for acute problem. Your oxygen level is 97% on room air.  I suspect you have an upper respiratory infection. Please increase fluids. Use tylenol every 4 hours, and or ibuprofen for fever or body aching. Please use gatorade, and foods high in vitamins and nutrients to maintain hydration and balance. See MD at the [facility] for additional evaluation if not improving.[facility]  [address] [place], [place] [address] [phone]  Services [facility] offers a variety of basic health services.  Services include but are not limited to: Blood pressure checks  Heart rate checks  Blood sugar checks  Urine analysis  Rapid strep tests  Pregnancy tests.  Health education and referrals  People needing more complex services will be directed to a physician online. Using these virtual visits, doctors can evaluate and prescribe medicine and treatments. There will be no medication on-site, though [facility] will help patients fill their prescriptions at little to no cost.   For More information please go to: [email or web address]

## 2016-10-31 NOTE — ED Notes (Signed)
C/o body weakness and soreness.  Denies pain.  Pulled a large tick off Wednesday from left forearm.  Pt says he did get the head.

## 2016-10-31 NOTE — ED Notes (Signed)
Patient transported to X-ray 

## 2017-05-03 ENCOUNTER — Emergency Department (HOSPITAL_COMMUNITY): Payer: No Typology Code available for payment source

## 2017-05-03 ENCOUNTER — Emergency Department (HOSPITAL_COMMUNITY)
Admission: EM | Admit: 2017-05-03 | Discharge: 2017-05-03 | Disposition: A | Discharge status: Home / Self Care | Payer: No Typology Code available for payment source | Attending: Emergency Medicine | Admitting: Emergency Medicine

## 2017-05-03 ENCOUNTER — Encounter (HOSPITAL_COMMUNITY): Payer: Self-pay | Admitting: Emergency Medicine

## 2017-05-03 ENCOUNTER — Other Ambulatory Visit: Payer: Self-pay

## 2017-05-03 DIAGNOSIS — Y939 Activity, unspecified: Secondary | ICD-10-CM | POA: Insufficient documentation

## 2017-05-03 DIAGNOSIS — Y929 Unspecified place or not applicable: Secondary | ICD-10-CM | POA: Diagnosis not present

## 2017-05-03 DIAGNOSIS — S93401A Sprain of unspecified ligament of right ankle, initial encounter: Secondary | ICD-10-CM

## 2017-05-03 DIAGNOSIS — Y999 Unspecified external cause status: Secondary | ICD-10-CM | POA: Diagnosis not present

## 2017-05-03 DIAGNOSIS — W109XXA Fall (on) (from) unspecified stairs and steps, initial encounter: Secondary | ICD-10-CM | POA: Diagnosis not present

## 2017-05-03 DIAGNOSIS — S99911A Unspecified injury of right ankle, initial encounter: Secondary | ICD-10-CM | POA: Diagnosis present

## 2017-05-03 MED ORDER — DICLOFENAC SODIUM 75 MG PO TBEC: 75.0000 mg | DELAYED_RELEASE_TABLET | Freq: Two times a day (BID) | ORAL | 0 refills | Status: DC

## 2017-05-03 NOTE — ED Provider Notes (Signed)
[facility] EMERGENCY DEPARTMENT Provider Note   CSN: [id number] Arrival date & time: 05/03/17  1458     History   Chief Complaint Chief Complaint  Patient presents with  . Ankle Pain    HPI Bruce Griffith is a 27 y.o. male.  HPI  Bruce Griffith is a 27 y.o. male who presents to the Emergency Department complaining of right ankle pain since last evening.  He states that he twisted his ankle and fell down some steps last evening.  He reports hearing a "pop" and noticed immediate swelling to both sides of his ankle.  Pain is worse with flexion of his ankle and with weightbearing.  Improves slightly at rest.  He tried using an ace wrap to his ankle last evening with minimal relief.  He denies other injuries, numbness or weakness of his extremity open wounds or deformities.   Past Medical History:  Diagnosis Date  . Laceration of left palm 04/14/2016    Patient Active Problem List   Diagnosis Date Noted  . Psoriasis     Past Surgical History:  Procedure Laterality Date  . HAND SURGERY Left   . NO PAST SURGERIES    . WOUND EXPLORATION Left 04/20/2016   Procedure: LEFT PALM WOUND EXPLORATION;  Surgeon: Betha LoaKevin Kuzma, MD;  Location: [facility];  Service: Orthopedics;  Laterality: Left;       Home Medications    Prior to Admission medications   Medication Sig Start Date End Date Taking? Authorizing Provider  acetaminophen (TYLENOL) 500 MG tablet Take 2 tablets by mouth every 6 (six) hours.    [provider]  diclofenac (VOLTAREN) 75 MG EC tablet Take 1 tablet (75 mg total) by mouth 2 (two) times daily. Take with food 05/03/17   Brittnie Lewey, PA-C  Phenyleph-Doxylamine-DM-APAP (COLD/FLU RELIEF DAY/NIGHT PO) Take 2 capsules by mouth every 4 (four) hours as needed (cold).    [provider]    Family History History reviewed. No pertinent family history.  Social History Social History   Tobacco Use  . Smoking status: Never Smoker  .  Smokeless tobacco: Current User    Types: Chew  Substance Use Topics  . Alcohol use: No  . Drug use: No     Allergies   Ceclor [cefaclor] and Septra [sulfamethoxazole-trimethoprim]   Review of Systems Review of Systems  Constitutional: Negative for chills and fever.  Musculoskeletal: Positive for arthralgias (Right ankle pain) and joint swelling. Negative for back pain and neck pain.  Skin: Negative for color change and wound.  Neurological: Negative for weakness and numbness.  All other systems reviewed and are negative.    Physical Exam Updated Vital Signs BP (!) 146/72 (BP Location: Right Arm)   Pulse 79   Temp 98.1 F (36.7 C) (Oral)   Resp 18   Ht 6' (1.829 m)   Wt 129.3 kg (285 lb)   SpO2 98%   BMI 38.65 kg/m   Physical Exam  Constitutional: He is oriented to person, place, and time. He appears well-developed and well-nourished. No distress.  HENT:  Head: Normocephalic and atraumatic.  Cardiovascular: Normal rate, regular rhythm and intact distal pulses.  Pulmonary/Chest: Effort normal and breath sounds normal.  Musculoskeletal: He exhibits edema and tenderness. He exhibits no deformity.  Tenderness to palpation of the medial and lateral aspects of the right ankle, mild to moderate edema noted.  No erythema, abrasion, bruising or bony deformity.  No proximal tenderness or edema  Neurological: He  is alert and oriented to person, place, and time. No sensory deficit. He exhibits normal muscle tone. Coordination normal.  Skin: Skin is warm and dry. Capillary refill takes less than 2 seconds.  Nursing note and vitals reviewed.    ED Treatments / Results  Labs (all labs ordered are listed, but only abnormal results are displayed) Labs Reviewed - No data to display  EKG  EKG Interpretation None       Radiology Dg Ankle Complete Right  Result Date: 05/03/2017 CLINICAL DATA:  Pain and swelling after sudden motion EXAM: RIGHT ANKLE - COMPLETE 3+ VIEW  COMPARISON:  October 02, 2006 FINDINGS: Frontal, oblique, and lateral views were obtained. There is generalized soft tissue swelling. There is no appreciable fracture or joint effusion. There is no appreciable joint space narrowing or erosion. The ankle mortise appears intact. IMPRESSION: Soft tissue swelling. No fracture or apparent arthropathy. Ankle mortise appears intact. Electronically Signed   By: Bretta Bang III M.D.   On: 05/03/2017 16:04    Procedures Procedures (including critical care time)  Medications Ordered in ED Medications - No data to display   Initial Impression / Assessment and Plan / ED Course  I have reviewed the triage vital signs and the nursing notes.  Pertinent labs & imaging results that were available during my care of the patient were reviewed by me and considered in my medical decision making (see chart for details).     Patient with a mechanical injury of the right ankle, x-rays are negative for fracture or bony deformity.  Neurovascularly intact.  Patient agrees to treatment plan which includes nonsteroidal, elevation, ice, and close orthopedic follow-up.  Patient prefers local orthopedics.  ASO splint applied and crutches provided.  Patient appears safe for discharge home and agrees to plan  Final Clinical Impressions(s) / ED Diagnoses   Final diagnoses:  Sprain of right ankle, unspecified ligament, initial encounter    ED Discharge Orders        Ordered    diclofenac (VOLTAREN) 75 MG EC tablet  2 times daily     05/03/17 1633       Pauline Aus, PA-C 05/03/17 1641    Pauline Aus, PA-C 05/03/17 1642    Mancel Bale, MD 05/04/17 [phone]

## 2017-05-03 NOTE — Discharge Instructions (Signed)
Elevate and apply ice packs on and off to your ankle.  Use her crutches for weightbearing as needed.  Call the orthopedic doctor listed to arrange a follow-up appointment in 1 week if not improving.

## 2017-05-03 NOTE — ED Triage Notes (Signed)
Pt reports ankle pain since turning and falling last night. Pt reports is able to bear weight but reports increase in pain. Pt reports history of same but reports "never had it swelling on both sides." moderate swelling noted.

## 2018-03-30 ENCOUNTER — Emergency Department (HOSPITAL_COMMUNITY)
Admission: EM | Admit: 2018-03-30 | Discharge: 2018-03-30 | Disposition: A | Discharge status: Home / Self Care | Payer: No Typology Code available for payment source | Attending: Emergency Medicine | Admitting: Emergency Medicine

## 2018-03-30 ENCOUNTER — Other Ambulatory Visit: Payer: Self-pay

## 2018-03-30 ENCOUNTER — Encounter (HOSPITAL_COMMUNITY): Payer: Self-pay | Admitting: Emergency Medicine

## 2018-03-30 DIAGNOSIS — Z79899 Other long term (current) drug therapy: Secondary | ICD-10-CM | POA: Insufficient documentation

## 2018-03-30 DIAGNOSIS — J020 Streptococcal pharyngitis: Secondary | ICD-10-CM | POA: Insufficient documentation

## 2018-03-30 DIAGNOSIS — F17220 Nicotine dependence, chewing tobacco, uncomplicated: Secondary | ICD-10-CM | POA: Insufficient documentation

## 2018-03-30 LAB — INFLUENZA PANEL BY PCR (TYPE A & B)
INFLAPCR: NEGATIVE
INFLBPCR: NEGATIVE

## 2018-03-30 LAB — GROUP A STREP BY PCR: GROUP A STREP BY PCR: DETECTED — AB

## 2018-03-30 MED ORDER — PENICILLIN G BENZATHINE & PROC 900000-300000 UNIT/2ML IM SUSP
1.2000 10*6.[IU] | Freq: Once | INTRAMUSCULAR | Status: DC
  Filled 2018-03-30: qty 2

## 2018-03-30 MED ORDER — LIDOCAINE VISCOUS HCL 2 % MT SOLN
15.0000 mL | Freq: Once | OROMUCOSAL | Status: AC
  Administered 2018-03-30: 15 mL via OROMUCOSAL
  Filled 2018-03-30: qty 15

## 2018-03-30 MED ORDER — ACETAMINOPHEN 325 MG PO TABS
650.0000 mg | ORAL_TABLET | Freq: Once | ORAL | Status: AC
  Administered 2018-03-30: 650 mg via ORAL
  Filled 2018-03-30: qty 2

## 2018-03-30 MED ORDER — PENICILLIN G BENZATHINE 1200000 UNIT/2ML IM SUSP
INTRAMUSCULAR | Status: AC
  Administered 2018-03-30: 1200000 [IU]
  Filled 2018-03-30: qty 2

## 2018-03-30 MED ORDER — LIDOCAINE VISCOUS HCL 2 % MT SOLN: 15.0000 mL | OROMUCOSAL | 0 refills | Status: DC | PRN

## 2018-03-30 MED ORDER — DEXAMETHASONE 10 MG/ML FOR PEDIATRIC ORAL USE
10.0000 mg | Freq: Once | INTRAMUSCULAR | Status: AC
  Administered 2018-03-30: 10 mg via ORAL
  Filled 2018-03-30: qty 1

## 2018-03-30 NOTE — ED Triage Notes (Signed)
Pt states he has been running a fever with sore throat and body aches since yesterday.  Cousin dx with strep and flu a few days ago.  Tonsillar enlargement noted.

## 2018-03-30 NOTE — Discharge Instructions (Addendum)
You have been diagnosed today with Strep Throat.  At this time there does not appear to be the presence of an emergent medical condition, however there is always the potential for conditions to change. Please read and follow the below instructions.  Please return to the Emergency Department immediately for any new or worsening symptoms or if your symptoms do not improve within 2 days. Please be sure to follow up with your Primary Care Provider this week possible regarding your visit today; please call their office to schedule an appointment even if you are feeling better for a follow-up visit. You may use the lidocaine mouth rinse as prescribed to help with your sore throat. You may use over-the-counter anti-inflammatories such as Tylenol and ibuprofen as directed on the packaging to help with your symptoms. Please be sure to drink plenty of water and get plenty of rest to help with your symptoms.  Contact a health care provider if: The glands in your neck continue to get bigger. You develop a rash, cough, or earache. You cough up a thick liquid that is green, yellow-brown, or bloody. You have pain or discomfort that does not get better with medicine. Your problems seem to be getting worse rather than better. You have a fever. Get help right away if: You have new symptoms, such as vomiting, severe headache, stiff or painful neck, chest pain, or shortness of breath. You have severe throat pain, drooling, or changes in your voice. You have swelling of the neck, or the skin on the neck becomes red and tender. You have signs of dehydration, such as fatigue, dry mouth, and decreased urination. You become increasingly sleepy, or you cannot wake up completely. Your joints become red or painful.  Please read the additional information packets attached to your discharge summary.  Do not take your medicine if  develop an itchy rash, swelling in your mouth or lips, or difficulty breathing.

## 2018-03-30 NOTE — ED Provider Notes (Signed)
[facility] EMERGENCY DEPARTMENT Provider Note   CSN: [id number] Arrival date & time: 03/30/18  1027     History   Chief Complaint Chief Complaint  Patient presents with  . Sore Throat    HPI Bruce Griffith is a 28 y.o. male presenting today for 2-day history of sore throat, rhinorrhea and generalized body aches.  Patient describes his sore throat as a moderate in intensity bilateral burning sensation constant and worsened with swallowing.  Patient states he has been eating and drinking this morning.  He denies difficulty breathing or inability to swallow.  Patient endorses subjective fevers but has not measured temperature at home.  Additionally patient with generalized body aches for 2 days, denies focal area of pain.  He denies cough, chest pain or shortness of breath.  He denies abdominal pain, nausea/vomiting or diarrhea.  Patient states that aside from above complaints that he is feeling well today.  HPI  Past Medical History:  Diagnosis Date  . Laceration of left palm 04/14/2016    Patient Active Problem List   Diagnosis Date Noted  . Psoriasis     Past Surgical History:  Procedure Laterality Date  . HAND SURGERY Left   . NO PAST SURGERIES    . WOUND EXPLORATION Left 04/20/2016   Procedure: LEFT PALM WOUND EXPLORATION;  Surgeon: Betha Loa, MD;  Location: [facility];  Service: Orthopedics;  Laterality: Left;        Home Medications    Prior to Admission medications   Medication Sig Start Date End Date Taking? Authorizing Provider  acetaminophen (TYLENOL) 500 MG tablet Take 2 tablets by mouth every 6 (six) hours.    [provider]  diclofenac (VOLTAREN) 75 MG EC tablet Take 1 tablet (75 mg total) by mouth 2 (two) times daily. Take with food 05/03/17   Triplett, Tammy, PA-C  lidocaine (XYLOCAINE) 2 % solution Use as directed 15 mLs in the mouth or throat as needed for mouth pain (Do NOT swallow). 03/30/18   Harlene Salts A, PA-C    Phenyleph-Doxylamine-DM-APAP (COLD/FLU RELIEF DAY/NIGHT PO) Take 2 capsules by mouth every 4 (four) hours as needed (cold).    [provider]    Family History History reviewed. No pertinent family history.  Social History Social History   Tobacco Use  . Smoking status: Never Smoker  . Smokeless tobacco: Current User    Types: Chew  Substance Use Topics  . Alcohol use: No  . Drug use: No     Allergies   Ceclor [cefaclor] and Septra [sulfamethoxazole-trimethoprim]   Review of Systems Review of Systems  Constitutional: Positive for fever (Subjective). Negative for chills.  HENT: Positive for congestion, rhinorrhea and sore throat. Negative for facial swelling, sinus pressure, sinus pain, trouble swallowing and voice change.   Respiratory: Negative.  Negative for cough and shortness of breath.   Cardiovascular: Negative.  Negative for chest pain and leg swelling.  Gastrointestinal: Negative.  Negative for abdominal pain, diarrhea, nausea and vomiting.  Musculoskeletal: Positive for arthralgias and myalgias. Negative for neck pain and neck stiffness.  Skin: Negative.  Negative for rash.  Neurological: Negative.  Negative for dizziness, weakness and headaches.   Physical Exam Updated Vital Signs BP 131/68 (BP Location: Right Arm)   Pulse 90   Temp 99.5 F (37.5 C) (Oral)   Resp 18   Ht 6' (1.829 m)   Wt 122.5 kg   SpO2 97%   BMI 36.62 kg/m   Physical Exam Constitutional:  General: He is not in acute distress.    Appearance: He is well-developed. He is obese. He is not ill-appearing or diaphoretic.  HENT:     Head: Normocephalic and atraumatic.     Right Ear: Tympanic membrane, ear canal and external ear normal.     Left Ear: Tympanic membrane, ear canal and external ear normal.     Nose: Rhinorrhea present. Rhinorrhea is clear.     Right Sinus: No maxillary sinus tenderness or frontal sinus tenderness.     Left Sinus: No maxillary sinus tenderness  or frontal sinus tenderness.     Mouth/Throat:     Lips: Pink.     Pharynx: Uvula midline.     Tonsils: Tonsillar exudate present. No tonsillar abscesses. Swelling: 2+ on the right. 2+ on the left.     Comments: The patient has normal phonation and is in control of secretions. No stridor.  Midline uvula without edema. Soft palate rises symmetrically. Bilateral tonsils mildly erythematous with scant exudate, 2+ bilateral without touching uvula and without uvula shift. Tongue protrusion is normal, floor of mouth is soft. No trismus. No creptius on neck palpation and patient. No gingival erythema or fluctuance noted. Mucus membranes moist.  Eyes:     General: Vision grossly intact. Gaze aligned appropriately.     Extraocular Movements: Extraocular movements intact.     Conjunctiva/sclera: Conjunctivae normal.     Pupils: Pupils are equal, round, and reactive to light.  Neck:     Musculoskeletal: Full passive range of motion without pain, normal range of motion and neck supple. No neck rigidity.     Trachea: Trachea and phonation normal. No tracheal deviation.     Comments: No muffled phonation Swallowing secretions without difficulty Cardiovascular:     Rate and Rhythm: Normal rate and regular rhythm.     Pulses:          Dorsalis pedis pulses are 2+ on the right side and 2+ on the left side.       Posterior tibial pulses are 2+ on the right side and 2+ on the left side.     Heart sounds: Normal heart sounds.  Pulmonary:     Effort: Pulmonary effort is normal.     Breath sounds: Normal breath sounds and air entry. No wheezing or rhonchi.  Chest:     Chest wall: No tenderness.  Abdominal:     General: Bowel sounds are normal.     Palpations: Abdomen is soft.     Tenderness: There is no abdominal tenderness. There is no guarding or rebound.  Musculoskeletal:     Right lower leg: Normal.     Left lower leg: Normal.  Feet:     Right foot:     Protective Sensation: 3 sites tested. 3  sites sensed.     Left foot:     Protective Sensation: 3 sites tested. 3 sites sensed.  Skin:    General: Skin is warm and dry.  Neurological:     General: No focal deficit present.     Mental Status: He is alert.     GCS: GCS eye subscore is 4. GCS verbal subscore is 5. GCS motor subscore is 6.     Comments: Speech is clear and goal oriented, follows commands Major Cranial nerves without deficit, no facial droop Normal strength in upper and lower extremities bilaterally including dorsiflexion and plantar flexion, strong and equal grip strength Sensation normal to light touch Moves extremities without ataxia, coordination intact  Normal gait    ED Treatments / Results  Labs (all labs ordered are listed, but only abnormal results are displayed) Labs Reviewed  GROUP A STREP BY PCR - Abnormal; Notable for the following components:      Result Value   Group A Strep by PCR DETECTED (*)    All other components within normal limits  INFLUENZA PANEL BY PCR (TYPE A & B)    EKG None  Radiology No results found.  Procedures Procedures (including critical care time)  Medications Ordered in ED Medications  dexamethasone (DECADRON) 10 MG/ML injection for Pediatric ORAL use 10 mg (10 mg Oral Given 03/30/18 1253)  acetaminophen (TYLENOL) tablet 650 mg (650 mg Oral Given 03/30/18 1254)  lidocaine (XYLOCAINE) 2 % viscous mouth solution 15 mL (15 mLs Mouth/Throat Given 03/30/18 1254)  penicillin g benzathine (BICILLIN LA) 1200000 UNIT/2ML injection (1,200,000 Units  Given 03/30/18 1300)     Initial Impression / Assessment and Plan / ED Course  I have reviewed the triage vital signs and the nursing notes.  Pertinent labs & imaging results that were available during my care of the patient were reviewed by me and considered in my medical decision making (see chart for details).    28 year old otherwise healthy male presenting today for 2-day history of sore throat and body aches.    Strep test  positive Influenza panel negative Vital signs within normal limits  Patient with mild cervical lymphadenopathy and mild dysphagia; diagnosis of strep. Treated in emergency department with Decadron, penicillin IM, Tylenol and viscous lidocaine.  Discussed importance of maintaining adequate hydration.  Presentation non concerning for peritonsillar abscess, Ludwig's angina, retropharyngeal abscess or other deep tissue infections of the head or neck. No trismus or uvula deviation. Specific return precautions discussed. Pt able to drink water in ED without difficulty with intact air way.  Patient reassessed approximately 1 hour after medication administration.  He is resting comfortably and in no acute distress.  Drinking without difficulty.  States improvement of symptoms following medication today.  Is requesting discharge at this time.  Anti-inflammatory medications, Tylenol and ibuprofen discussed.  Water intake discussed.  Viscous lidocaine prescribed for comfort, informed not to swallow this medication.  At this time there does not appear to be any evidence of an acute emergency medical condition and the patient appears stable for discharge with appropriate outpatient follow up. Diagnosis was discussed with patient who verbalizes understanding of care plan and is agreeable to discharge. I have discussed return precautions with patient and father at bedside who verbalize understanding of return precautions. Patient strongly encouraged to follow-up with their PCP this week. All questions answered.  Note: Portions of this report may have been transcribed using voice recognition software. Every effort was made to ensure accuracy; however, inadvertent computerized transcription errors may still be present. Final Clinical Impressions(s) / ED Diagnoses   Final diagnoses:  Strep throat    ED Discharge Orders         Ordered    lidocaine (XYLOCAINE) 2 % solution  As needed     03/30/18 1355            Elizabeth PalauMorelli, Brandon A, PA-C 03/30/18 1357    Vanetta MuldersZackowski, Scott, MD 03/30/18 1621

## 2018-05-14 ENCOUNTER — Encounter (HOSPITAL_COMMUNITY): Payer: Self-pay | Admitting: Emergency Medicine

## 2018-05-14 ENCOUNTER — Emergency Department (HOSPITAL_COMMUNITY)
Admission: EM | Admit: 2018-05-14 | Discharge: 2018-05-14 | Disposition: A | Discharge status: Home / Self Care | Payer: Self-pay | Attending: Emergency Medicine | Admitting: Emergency Medicine

## 2018-05-14 ENCOUNTER — Other Ambulatory Visit: Payer: Self-pay

## 2018-05-14 ENCOUNTER — Emergency Department (HOSPITAL_COMMUNITY): Payer: Self-pay

## 2018-05-14 DIAGNOSIS — S300XXA Contusion of lower back and pelvis, initial encounter: Secondary | ICD-10-CM | POA: Insufficient documentation

## 2018-05-14 DIAGNOSIS — Y999 Unspecified external cause status: Secondary | ICD-10-CM | POA: Insufficient documentation

## 2018-05-14 DIAGNOSIS — Y9301 Activity, walking, marching and hiking: Secondary | ICD-10-CM | POA: Insufficient documentation

## 2018-05-14 DIAGNOSIS — W109XXA Fall (on) (from) unspecified stairs and steps, initial encounter: Secondary | ICD-10-CM | POA: Insufficient documentation

## 2018-05-14 DIAGNOSIS — Y929 Unspecified place or not applicable: Secondary | ICD-10-CM | POA: Insufficient documentation

## 2018-05-14 MED ORDER — METHOCARBAMOL 500 MG PO TABS: 500.0000 mg | ORAL_TABLET | Freq: Three times a day (TID) | ORAL | 0 refills | Status: DC | PRN

## 2018-05-14 NOTE — ED Triage Notes (Signed)
Fell 1 week ago  Pain to "tailbone" And sharp pain "when I tighten my butt cheeks"  Taking motrin 800 mg "every four to six" without much relief
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
## 2018-05-22 ENCOUNTER — Emergency Department (HOSPITAL_COMMUNITY): Payer: No Typology Code available for payment source

## 2018-05-22 ENCOUNTER — Emergency Department (HOSPITAL_COMMUNITY)
Admission: EM | Admit: 2018-05-22 | Discharge: 2018-05-22 | Disposition: A | Discharge status: Home / Self Care | Payer: No Typology Code available for payment source | Attending: Emergency Medicine | Admitting: Emergency Medicine

## 2018-05-22 ENCOUNTER — Other Ambulatory Visit: Payer: Self-pay

## 2018-05-22 ENCOUNTER — Encounter (HOSPITAL_COMMUNITY): Payer: Self-pay | Admitting: Emergency Medicine

## 2018-05-22 DIAGNOSIS — M25561 Pain in right knee: Secondary | ICD-10-CM | POA: Insufficient documentation

## 2018-05-22 DIAGNOSIS — R079 Chest pain, unspecified: Secondary | ICD-10-CM | POA: Diagnosis not present

## 2018-05-22 DIAGNOSIS — M7918 Myalgia, other site: Secondary | ICD-10-CM

## 2018-05-22 MED ORDER — IBUPROFEN 600 MG PO TABS: 600.0000 mg | ORAL_TABLET | Freq: Four times a day (QID) | ORAL | 0 refills | Status: DC | PRN

## 2018-05-22 MED ORDER — CYCLOBENZAPRINE HCL 5 MG PO TABS: 5.0000 mg | ORAL_TABLET | Freq: Three times a day (TID) | ORAL | 0 refills | Status: DC | PRN
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
## 2018-05-22 NOTE — ED Notes (Signed)
From Rad 

## 2018-05-22 NOTE — ED Notes (Signed)
To Rad 
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
## 2018-05-27 ENCOUNTER — Emergency Department (HOSPITAL_COMMUNITY)
Admission: EM | Admit: 2018-05-27 | Discharge: 2018-05-27 | Disposition: A | Discharge status: Home / Self Care | Payer: No Typology Code available for payment source | Attending: Emergency Medicine | Admitting: Emergency Medicine

## 2018-05-27 ENCOUNTER — Other Ambulatory Visit: Payer: Self-pay

## 2018-05-27 ENCOUNTER — Encounter (HOSPITAL_COMMUNITY): Payer: Self-pay | Admitting: Emergency Medicine

## 2018-05-27 ENCOUNTER — Emergency Department (HOSPITAL_COMMUNITY): Payer: No Typology Code available for payment source

## 2018-05-27 DIAGNOSIS — M546 Pain in thoracic spine: Secondary | ICD-10-CM | POA: Insufficient documentation

## 2018-05-27 DIAGNOSIS — Z79899 Other long term (current) drug therapy: Secondary | ICD-10-CM | POA: Insufficient documentation

## 2018-05-27 DIAGNOSIS — M25561 Pain in right knee: Secondary | ICD-10-CM | POA: Diagnosis present

## 2018-05-27 DIAGNOSIS — S20211D Contusion of right front wall of thorax, subsequent encounter: Secondary | ICD-10-CM | POA: Insufficient documentation

## 2018-05-27 MED ORDER — HYDROCODONE-ACETAMINOPHEN 5-325 MG PO TABS: 1.0000 | ORAL_TABLET | ORAL | 0 refills | Status: DC | PRN
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
## 2018-05-27 NOTE — Discharge Instructions (Addendum)
Your x-rays today are negative for acute bony injury.  As discussed however you may need to see Dr. Romeo Apple if your knee pain does not improve with a little bit more time.  You may use the medication prescribed for pain relief, however this medicine will make you drowsy, do not drive within 4 hours of taking it.  This is simply a pain reliever, it will not heal your injuries, the medications you were prescribed at your last visit should help heal your injury, especially the ibuprofen, it is recommended that you continue taking this medication.  Also recommend a heating pad for 20 minutes several times daily to any areas of pain.

## 2018-06-02 ENCOUNTER — Other Ambulatory Visit: Payer: Self-pay

## 2018-06-02 ENCOUNTER — Emergency Department (HOSPITAL_COMMUNITY)
Admission: EM | Admit: 2018-06-02 | Discharge: 2018-06-02 | Disposition: A | Discharge status: Home / Self Care | Payer: No Typology Code available for payment source | Attending: Emergency Medicine | Admitting: Emergency Medicine

## 2018-06-02 DIAGNOSIS — S8001XA Contusion of right knee, initial encounter: Secondary | ICD-10-CM | POA: Diagnosis not present

## 2018-06-02 DIAGNOSIS — Y929 Unspecified place or not applicable: Secondary | ICD-10-CM | POA: Diagnosis not present

## 2018-06-02 DIAGNOSIS — Z79899 Other long term (current) drug therapy: Secondary | ICD-10-CM | POA: Insufficient documentation

## 2018-06-02 DIAGNOSIS — Y999 Unspecified external cause status: Secondary | ICD-10-CM | POA: Diagnosis not present

## 2018-06-02 DIAGNOSIS — Y9389 Activity, other specified: Secondary | ICD-10-CM | POA: Diagnosis not present

## 2018-06-02 DIAGNOSIS — S20211A Contusion of right front wall of thorax, initial encounter: Secondary | ICD-10-CM | POA: Insufficient documentation

## 2018-06-02 DIAGNOSIS — S20211D Contusion of right front wall of thorax, subsequent encounter: Secondary | ICD-10-CM

## 2018-06-02 DIAGNOSIS — S8001XD Contusion of right knee, subsequent encounter: Secondary | ICD-10-CM

## 2018-06-02 NOTE — ED Notes (Signed)
Patient verbalizes understanding of discharge instructions . Opportunity for questions and answers were provided . Armband removed by staff ,Pt discharged from ED. W/C  offered at D/C  and Declined W/C at D/C and was escorted to lobby by RN.  
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
## 2019-04-18 ENCOUNTER — Other Ambulatory Visit: Payer: Self-pay

## 2019-04-18 ENCOUNTER — Ambulatory Visit: Payer: Self-pay | Attending: Internal Medicine

## 2019-04-18 DIAGNOSIS — Z20822 Contact with and (suspected) exposure to covid-19: Secondary | ICD-10-CM

## 2019-04-19 LAB — NOVEL CORONAVIRUS, NAA: SARS-CoV-2, NAA: DETECTED — AB

## 2019-04-20 ENCOUNTER — Telehealth: Payer: Self-pay | Admitting: Physician Assistant

## 2019-04-20 NOTE — Telephone Encounter (Signed)
  I connected by phone with Bruce Griffith on 04/20/2019 at 10:51 AM to discuss the potential use of an new treatment for mild to moderate COVID-19 viral infection in non-hospitalized patients.  This patient is a 29 y.o. male that meets the FDA criteria for Emergency Use Authorization of bamlanivimab or casirivimab\imdevimab.  Has a (+) direct SARS-CoV-2 viral test result  Has mild or moderate COVID-19   Is ? 27 years of age and weighs ? 40 kg  Is NOT hospitalized due to COVID-19  Is NOT requiring oxygen therapy or requiring an increase in baseline oxygen flow rate due to COVID-19  Is within 10 days of symptom onset  Has at least one of the high risk factor(s) for progression to severe COVID-19 and/or hospitalization as defined in EUA.  Specific high risk criteria : BMI 38   I have spoken and communicated the following to the patient or parent/caregiver:  1. FDA has authorized the emergency use of bamlanivimab and casirivimab\imdevimab for the treatment of mild to moderate COVID-19 in adults and pediatric patients with positive results of direct SARS-CoV-2 viral testing who are 86 years of age and older weighing at least 40 kg, and who are at high risk for progressing to severe COVID-19 and/or hospitalization.  2. The significant known and potential risks and benefits of bamlanivimab and casirivimab\imdevimab, and the extent to which such potential risks and benefits are unknown.  3. Information on available alternative treatments and the risks and benefits of those alternatives, including clinical trials.  4. Patients treated with bamlanivimab and casirivimab\imdevimab should continue to self-isolate and use infection control measures (e.g., wear mask, isolate, social distance, avoid sharing personal items, clean and disinfect "high touch" surfaces, and frequent handwashing) according to CDC guidelines.   5. The patient or parent/caregiver has the option to accept or refuse  bamlanivimab or casirivimab\imdevimab .  After reviewing this information with the patient, he has asked for some time to review things with his wife.  I informed him that a member of our team will reach back out to him and follow up.  Charletta Voight Celine Mans 04/20/2019 10:51 AM

## 2019-08-17 ENCOUNTER — Ambulatory Visit
Admission: EM | Admit: 2019-08-17 | Discharge: 2019-08-17 | Disposition: A | Discharge status: Home / Self Care | Payer: Self-pay | Attending: Physician Assistant | Admitting: Physician Assistant

## 2019-08-17 ENCOUNTER — Ambulatory Visit (INDEPENDENT_AMBULATORY_CARE_PROVIDER_SITE_OTHER): Payer: Self-pay

## 2019-08-17 DIAGNOSIS — S99912A Unspecified injury of left ankle, initial encounter: Secondary | ICD-10-CM

## 2019-08-17 DIAGNOSIS — M25572 Pain in left ankle and joints of left foot: Secondary | ICD-10-CM

## 2019-08-17 DIAGNOSIS — W1840XA Slipping, tripping and stumbling without falling, unspecified, initial encounter: Secondary | ICD-10-CM

## 2019-08-17 DIAGNOSIS — S96912A Strain of unspecified muscle and tendon at ankle and foot level, left foot, initial encounter: Secondary | ICD-10-CM

## 2019-08-17 MED ORDER — DICLOFENAC SODIUM 50 MG PO TBEC: 50.0000 mg | DELAYED_RELEASE_TABLET | Freq: Two times a day (BID) | ORAL | 0 refills | Status: DC
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
## 2019-08-17 NOTE — ED Provider Notes (Signed)
[name]    CSN: [id number] Arrival date & time: 08/17/19  3557      History   Chief Complaint Chief Complaint  Patient presents with  . Ankle Pain    HPI Bruce Griffith is a 29 y.o. male.   29 year old male comes in for left ankle pain after injury this morning. Slipped on water in the kitchen and both inverted and everted ankle in the process. Has had pain to ankle diffusely with radiation to the dorsal foot at rest, worse during movement/weightbearing. Denies numbness/tingling.      Past Medical History:  Diagnosis Date  . Laceration of left palm 04/14/2016    Patient Active Problem List   Diagnosis Date Noted  . Psoriasis     Past Surgical History:  Procedure Laterality Date  . HAND SURGERY Left   . NO PAST SURGERIES    . WOUND EXPLORATION Left 04/20/2016   Procedure: LEFT PALM WOUND EXPLORATION;  Surgeon: Leanora Cover, MD;  Location: [place];  Service: Orthopedics;  Laterality: Left;       Home Medications    Prior to Admission medications   Medication Sig Start Date End Date Taking? Authorizing Provider  diclofenac (VOLTAREN) 50 MG EC tablet Take 1 tablet (50 mg total) by mouth 2 (two) times daily. 08/17/19   Ok Edwards, PA-C    Family History Family History  Problem Relation Age of Onset  . Diabetes Father   . Diabetes Other   . Hypertension Other     Social History Social History   Tobacco Use  . Smoking status: Never Smoker  . Smokeless tobacco: Current User    Types: Chew  Substance Use Topics  . Alcohol use: No  . Drug use: No     Allergies   Ceclor [cefaclor] and Septra [sulfamethoxazole-trimethoprim]   Review of Systems Review of Systems  Reason unable to perform ROS: See HPI as above.     Physical Exam Triage Vital Signs ED Triage Vitals  Enc Vitals Group     BP 08/17/19 0938 128/76     Pulse Rate 08/17/19 0938 68     Resp 08/17/19 0938 18     Temp 08/17/19 0938 98.3 F (36.8 C)    Temp Source 08/17/19 0938 Oral     SpO2 08/17/19 0938 99 %     Weight --      Height --      Head Circumference --      Peak Flow --      Pain Score 08/17/19 0939 8     Pain Loc --      Pain Edu? --      Excl. in [place]? --    No data found.  Updated Vital Signs BP 128/76 (BP Location: Left Arm)   Pulse 68   Temp 98.3 F (36.8 C) (Oral)   Resp 18   SpO2 99%   Physical Exam Constitutional:      General: He is not in acute distress.    Appearance: Normal appearance. He is well-developed. He is not toxic-appearing or diaphoretic.  HENT:     Head: Normocephalic and atraumatic.  Eyes:     Conjunctiva/sclera: Conjunctivae normal.     Pupils: Pupils are equal, round, and reactive to light.  Pulmonary:     Effort: Pulmonary effort is normal. No respiratory distress.     Comments: Speaking in full sentences without difficulty Musculoskeletal:     Cervical back:  Normal range of motion and neck supple.     Comments: Swelling to bilateral malleolus of the left ankle without erythema, warmth, contusion.  Diffuse tenderness to palpation of the ankle.  No tenderness to palpation of the foot.  Full ROM, though with pain. Strength deferred. NVI  Skin:    General: Skin is warm and dry.  Neurological:     Mental Status: He is alert and oriented to person, place, and time.      UC Treatments / Results  Labs (all labs ordered are listed, but only abnormal results are displayed) Labs Reviewed - No data to display  EKG   Radiology DG Ankle Complete Left  Result Date: 08/17/2019 CLINICAL DATA:  Recent fall with ankle pain, initial encounter EXAM: LEFT ANKLE COMPLETE - 3+ VIEW COMPARISON:  05/15/2007 FINDINGS: Small bony densities are noted adjacent to the lateral and medial malleoli consistent with prior trauma and nonunion. Mild soft tissue swelling is noted. Small calcaneal spurs are noted. IMPRESSION: Changes of prior trauma and nonunion. Mild soft tissue swelling is seen. No acute  bony abnormality is noted. Electronically Signed   By: Alcide Clever M.D.   On: 08/17/2019 10:08    Procedures Procedures (including critical care time)  Medications Ordered in UC Medications - No data to display  Initial Impression / Assessment and Plan / UC Course  I have reviewed the triage vital signs and the nursing notes.  Pertinent labs & imaging results that were available during my care of the patient were reviewed by me and considered in my medical decision making (see chart for details).    X-ray without acute fracture or dislocation.  NSAIDs, ice compress, rest.  Patient with infant at home, will defer from crutches, and provide CAM Walker for symptomatic management.  Expected course of healing discussed.  Return precautions given.  Patient expresses understanding and agrees to plan.  Final Clinical Impressions(s) / UC Diagnoses   Final diagnoses:  Strain of left ankle, initial encounter  Injury of left ankle, initial encounter    ED Prescriptions    Medication Sig Dispense Auth. Provider   diclofenac (VOLTAREN) 50 MG EC tablet Take 1 tablet (50 mg total) by mouth 2 (two) times daily. 20 tablet Belinda Fisher, PA-C     PDMP not reviewed this encounter.   Belinda Fisher, PA-C 08/17/19 1027

## 2019-08-17 NOTE — Discharge Instructions (Signed)
X-rays negative for fracture or dislocation. Start diclofenac. Do not take ibuprofen (motrin/advil)/ naproxen (aleve) while on diclofenac. Ice compress, elevation, rest, cam walker during activity. This may take a few weeks to completely resolve, but should be feeling better each week. Follow up with PCP/sports medicine for further evaluation if symptoms not improving.
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
## 2020-05-30 ENCOUNTER — Ambulatory Visit
Admission: EM | Admit: 2020-05-30 | Discharge: 2020-05-30 | Disposition: A | Discharge status: Home / Self Care | Payer: Self-pay

## 2020-05-30 ENCOUNTER — Other Ambulatory Visit: Payer: Self-pay

## 2020-05-30 ENCOUNTER — Encounter: Payer: Self-pay | Admitting: Emergency Medicine

## 2020-05-30 DIAGNOSIS — J039 Acute tonsillitis, unspecified: Secondary | ICD-10-CM | POA: Insufficient documentation

## 2020-05-30 DIAGNOSIS — J069 Acute upper respiratory infection, unspecified: Secondary | ICD-10-CM | POA: Insufficient documentation

## 2020-05-30 LAB — POCT RAPID STREP A (OFFICE): Rapid Strep A Screen: NEGATIVE

## 2020-05-30 MED ORDER — PREDNISONE 50 MG PO TABS: ORAL_TABLET | ORAL | 0 refills | Status: DC

## 2020-05-30 MED ORDER — LIDOCAINE VISCOUS HCL 2 % MT SOLN: 10.0000 mL | OROMUCOSAL | 0 refills | Status: DC | PRN

## 2020-05-30 NOTE — ED Provider Notes (Signed)
[name]    CSN: [id number] Arrival date & time: 05/30/20  1516      History   Chief Complaint Chief Complaint  Patient presents with  . Sore Throat    HPI Bruce Griffith is a 30 y.o. male.   Patient presenting today with 2-day history of dizziness, chills, sweats, body aches, sore swollen throat, anorexia.  Very mild cough but unsure if this is related to current illness.  He denies difficulty breathing, chest pain, nausea, and vomiting.  So far taking DayQuil and NyQuil with minimal relief.  States history of recurrent strep infections that have felt very similar to this.  No known sick contacts recently.     Past Medical History:  Diagnosis Date  . Laceration of left palm 04/14/2016    Patient Active Problem List   Diagnosis Date Noted  . Psoriasis     Past Surgical History:  Procedure Laterality Date  . HAND SURGERY Left   . NO PAST SURGERIES    . WOUND EXPLORATION Left 04/20/2016   Procedure: LEFT PALM WOUND EXPLORATION;  Surgeon: Betha Loa, MD;  Location: [facility];  Service: Orthopedics;  Laterality: Left;       Home Medications    Prior to Admission medications   Medication Sig Start Date End Date Taking? Authorizing Provider  lidocaine (XYLOCAINE) 2 % solution Use as directed 10 mLs in the mouth or throat as needed for mouth pain. 05/30/20  Yes Particia Nearing, PA-C  predniSONE (DELTASONE) 50 MG tablet Take 1 tab daily with breakfast 05/30/20  Yes Particia Nearing, PA-C  Pseudoeph-Doxylamine-DM-APAP (NYQUIL PO) Take by mouth.   Yes [provider]  Pseudoephedrine-APAP-DM (DAYQUIL PO) Take by mouth.   Yes [provider]  diclofenac (VOLTAREN) 50 MG EC tablet Take 1 tablet (50 mg total) by mouth 2 (two) times daily. 08/17/19   Belinda Fisher, PA-C    Family History Family History  Problem Relation Age of Onset  . Diabetes Father   . Diabetes Other   . Hypertension Other     Social  History Social History   Tobacco Use  . Smoking status: Never Smoker  . Smokeless tobacco: Current User    Types: Chew  Vaping Use  . Vaping Use: Never used  Substance Use Topics  . Alcohol use: No  . Drug use: No     Allergies   Ceclor [cefaclor] and Septra [sulfamethoxazole-trimethoprim]   Review of Systems Review of Systems Per HPI  Physical Exam Triage Vital Signs ED Triage Vitals  Enc Vitals Group     BP 05/30/20 1603 117/83     Pulse Rate 05/30/20 1603 85     Resp 05/30/20 1603 20     Temp 05/30/20 1603 99.9 F (37.7 C)     Temp Source 05/30/20 1603 Oral     SpO2 05/30/20 1603 97 %     Weight --      Height --      Head Circumference --      Peak Flow --      Pain Score 05/30/20 1621 5     Pain Loc --      Pain Edu? --      Excl. in [place]? --    No data found.  Updated Vital Signs BP 117/83 (BP Location: Left Arm)   Pulse 85   Temp 99.9 F (37.7 C) (Oral)   Resp 20   SpO2 97%   Visual  Acuity Right Eye Distance:   Left Eye Distance:   Bilateral Distance:    Right Eye Near:   Left Eye Near:    Bilateral Near:     Physical Exam Vitals and nursing note reviewed.  Constitutional:      Appearance: Normal appearance.  HENT:     Head: Atraumatic.     Right Ear: Tympanic membrane normal.     Left Ear: Tympanic membrane normal.     Nose: Nose normal.     Mouth/Throat:     Mouth: Mucous membranes are moist.     Pharynx: Oropharyngeal exudate and posterior oropharyngeal erythema present.     Comments: Significant bilateral tonsillar edema and erythema, right tonsillar exudates very apparent Eyes:     Extraocular Movements: Extraocular movements intact.     Conjunctiva/sclera: Conjunctivae normal.  Cardiovascular:     Rate and Rhythm: Normal rate and regular rhythm.  Pulmonary:     Effort: Pulmonary effort is normal.     Breath sounds: Normal breath sounds.  Abdominal:     General: Bowel sounds are normal. There is no distension.      Palpations: Abdomen is soft.     Tenderness: There is no abdominal tenderness. There is no guarding.  Musculoskeletal:        General: Normal range of motion.     Cervical back: Normal range of motion and neck supple.  Lymphadenopathy:     Cervical: Cervical adenopathy (right focal) present.  Skin:    General: Skin is warm and dry.  Neurological:     General: No focal deficit present.     Mental Status: He is oriented to person, place, and time.  Psychiatric:        Mood and Affect: Mood normal.        Thought Content: Thought content normal.        Judgment: Judgment normal.      UC Treatments / Results  Labs (all labs ordered are listed, but only abnormal results are displayed) Labs Reviewed  CULTURE, GROUP A STREP ([facility])  NOVEL CORONAVIRUS, NAA  POCT RAPID STREP A (OFFICE)    EKG   Radiology No results found.  Procedures Procedures (including critical care time)  Medications Ordered in UC Medications - No data to display  Initial Impression / Assessment and Plan / UC Course  I have reviewed the triage vital signs and the nursing notes.  Pertinent labs & imaging results that were available during my care of the patient were reviewed by me and considered in my medical decision making (see chart for details).     Vitals and exam reassuring today overall aside from significant tonsillitis, worse on the right.  His rapid strep test was negative, throat culture and Covid PCR pending today discussed quick prednisone burst for his tonsillar inflammation and viscous lidocaine as needed for pain.  Continue DayQuil and NyQuil as needed, await results.  Work note given with isolation instructions.  Return for acutely worsening symptoms anytime.  Final Clinical Impressions(s) / UC Diagnoses   Final diagnoses:  Acute tonsillitis, unspecified etiology  Viral URI   Discharge Instructions   None    ED Prescriptions    Medication Sig Dispense Auth. Provider   predniSONE  (DELTASONE) 50 MG tablet Take 1 tab daily with breakfast 3 tablet Particia Nearing, PA-C   lidocaine (XYLOCAINE) 2 % solution Use as directed 10 mLs in the mouth or throat as needed for mouth pain. 100 mL Roosvelt Maser  Lanora Manis, PA-C     PDMP not reviewed this encounter.   Particia Nearing, [place] 05/30/20 1710

## 2020-05-30 NOTE — ED Triage Notes (Signed)
Patient started feeling sick Tuesday night.  Initially it was dizziness, chills, over the past 2 days throat started hurting more, particularly on right side of face and neck.  patient feels chills.

## 2020-05-31 LAB — SARS-COV-2, NAA 2 DAY TAT

## 2020-05-31 LAB — NOVEL CORONAVIRUS, NAA: SARS-CoV-2, NAA: NOT DETECTED

## 2020-06-01 ENCOUNTER — Telehealth: Payer: Self-pay | Admitting: *Deleted

## 2020-06-01 MED ORDER — AMOXICILLIN-POT CLAVULANATE 875-125 MG PO TABS: 1.0000 | ORAL_TABLET | Freq: Two times a day (BID) | ORAL | 0 refills | Status: DC

## 2020-06-01 NOTE — Telephone Encounter (Signed)
Wife states pt is concerned that he is taking the prednisone and using the lidocaine as prescribed, but continues to have extreme sore throat without any improvement.  Inquiring if pt needs an abx or something different.  Provider notes reviewed w/ H. Wieters, PA: based on provider's exam notes and pt without improvement, and strep cx not complete yet, she will send in Rx for Augmentin 875/125mg  PO BID x 10 days, no refills; if pt does not start having improvement with abx after a couple days, or if at any point sxs are getting worse, pt needs to have eval again for possibly ruling out mono or peritonsillar abscess; instructed to go to ED if throat feels it is getting more swollen or tight.  Pt and spouse notified of above and verbalized understanding.

## 2020-06-02 LAB — CULTURE, GROUP A STREP (THRC)

## 2020-11-18 ENCOUNTER — Other Ambulatory Visit: Payer: Self-pay

## 2020-11-18 ENCOUNTER — Ambulatory Visit
Admission: EM | Admit: 2020-11-18 | Discharge: 2020-11-18 | Disposition: A | Discharge status: Home / Self Care | Payer: Self-pay | Attending: Internal Medicine | Admitting: Internal Medicine

## 2020-11-18 ENCOUNTER — Encounter: Payer: Self-pay | Admitting: *Deleted

## 2020-11-18 DIAGNOSIS — R42 Dizziness and giddiness: Secondary | ICD-10-CM

## 2020-11-18 DIAGNOSIS — H6592 Unspecified nonsuppurative otitis media, left ear: Secondary | ICD-10-CM

## 2020-11-18 DIAGNOSIS — R0981 Nasal congestion: Secondary | ICD-10-CM

## 2020-11-18 DIAGNOSIS — H66002 Acute suppurative otitis media without spontaneous rupture of ear drum, left ear: Secondary | ICD-10-CM

## 2020-11-18 LAB — POCT FASTING CBG KUC MANUAL ENTRY: POCT Glucose (KUC): 94 mg/dL (ref 70–99)

## 2020-11-18 MED ORDER — AZITHROMYCIN 250 MG PO TABS: ORAL_TABLET | ORAL | 0 refills | Status: DC

## 2020-11-18 MED ORDER — PREDNISONE 20 MG PO TABS: 20.0000 mg | ORAL_TABLET | Freq: Every day | ORAL | 0 refills | Status: AC

## 2020-11-18 NOTE — ED Triage Notes (Signed)
CBG 94 

## 2020-11-18 NOTE — Discharge Instructions (Addendum)
Recommend taking home COVID test if your symptoms do not improve. You can develop COVID-19 symptoms up to 21 days following an initial exposure. Complete all medication as prescribed.

## 2020-11-18 NOTE — ED Provider Notes (Signed)
[facility]    CSN: [id number] Arrival date & time: 11/18/20  1557      History   Chief Complaint Chief Complaint  Patient presents with   Weakness    HPI Bruce Griffith is a 30 y.o. male.   HPI Patient report acute onset weakness, dizziness, and feeing fatigued while working today. Concern that he may be a diabetic blood sugar checked which was 94 within normal range and patient is not fasting. He endorses a history of vertigo and has experienced nasal congestion and sinus pressure. Reports his brother is COVID positive he last had contact with him 2 weeks ago. Denies fever. Hydrating well with fluids and eating normally.    Past Medical History:  Diagnosis Date   Laceration of left palm 04/14/2016    Patient Active Problem List   Diagnosis Date Noted   Psoriasis     Past Surgical History:  Procedure Laterality Date   HAND SURGERY Left    NO PAST SURGERIES     WOUND EXPLORATION Left 04/20/2016   Procedure: LEFT PALM WOUND EXPLORATION;  Surgeon: Betha Loa, MD;  Location: [facility];  Service: Orthopedics;  Laterality: Left;       Home Medications    Prior to Admission medications   Medication Sig Start Date End Date Taking? Authorizing Provider  azithromycin (ZITHROMAX) 250 MG tablet Take 2 tabs PO x 1 dose, then 1 tab PO QD x 4 days 11/18/20  Yes Bing Neighbors, FNP  predniSONE (DELTASONE) 20 MG tablet Take 1 tablet (20 mg total) by mouth daily with breakfast for 5 days. 11/18/20 11/23/20 Yes Bing Neighbors, FNP  amoxicillin-clavulanate (AUGMENTIN) 875-125 MG tablet Take 1 tablet by mouth every 12 (twelve) hours. 06/01/20   Wieters, Hallie C, PA-C  diclofenac (VOLTAREN) 50 MG EC tablet Take 1 tablet (50 mg total) by mouth 2 (two) times daily. 08/17/19   Cathie Hoops, Amy V, PA-C  lidocaine (XYLOCAINE) 2 % solution Use as directed 10 mLs in the mouth or throat as needed for mouth pain. 05/30/20   Particia Nearing, PA-C   Pseudoeph-Doxylamine-DM-APAP (NYQUIL PO) Take by mouth.    [provider]  Pseudoephedrine-APAP-DM (DAYQUIL PO) Take by mouth.    [provider]    Family History Family History  Problem Relation Age of Onset   Diabetes Father    Diabetes Other    Hypertension Other     Social History Social History   Tobacco Use   Smoking status: Never   Smokeless tobacco: Current    Types: Chew  Vaping Use   Vaping Use: Never used  Substance Use Topics   Alcohol use: No   Drug use: No     Allergies   Ceclor [cefaclor] and Septra [sulfamethoxazole-trimethoprim]   Review of Systems Review of Systems Pertinent negatives listed in HPI  Physical Exam Triage Vital Signs ED Triage Vitals  Enc Vitals Group     BP 11/18/20 1615 136/88     Pulse Rate 11/18/20 1615 79     Resp 11/18/20 1615 18     Temp 11/18/20 1615 98.1 F (36.7 C)     Temp Source 11/18/20 1615 Oral     SpO2 11/18/20 1615 97 %     Weight --      Height --      Head Circumference --      Peak Flow --      Pain Score 11/18/20 1616 0  Pain Loc --      Pain Edu? --      Excl. in [place]? --    No data found.  Updated Vital Signs BP 136/88   Pulse 79   Temp 98.1 F (36.7 C) (Oral)   Resp 18   SpO2 97%   Visual Acuity Right Eye Distance:   Left Eye Distance:   Bilateral Distance:    Right Eye Near:   Left Eye Near:    Bilateral Near:     Physical Exam General Appearance:    Alert, cooperative, no distress  HENT:   left TM red, dull, bulging, left TM fluid noted, neck without nodes, throat normal without erythema or exudate, and sinuses nontender  Eyes:    PERRL, conjunctiva/corneas clear, EOM's intact       Lungs:     Clear to auscultation bilaterally, respirations unlabored  Heart:    Regular rate and rhythm  Neurologic:   Awake, alert, oriented x 3. No apparent focal neurological           defect.         UC Treatments / Results  Labs (all labs ordered are listed, but only  abnormal results are displayed) Labs Reviewed  POCT FASTING CBG [facility] MANUAL ENTRY    EKG   Radiology No results found.  Procedures Procedures (including critical care time)  Medications Ordered in UC Medications - No data to display  Initial Impression / Assessment and Plan / UC Course  I have reviewed the triage vital signs and the nursing notes.  Pertinent labs & imaging results that were available during my care of the patient were reviewed by me and considered in my medical decision making (see chart for details).    Treatment per discharge medication orders. BS normal. VSS. Suspect dizziness is related to inner ear dysfunction. Recommend taking home COVID test if symptoms do not readily improve Work note provided. Final Clinical Impressions(s) / UC Diagnoses   Final diagnoses:  Episode of dizziness  MEE (middle ear effusion), left  Nasal congestion  Non-recurrent acute suppurative otitis media of left ear without spontaneous rupture of tympanic membrane     Discharge Instructions      Recommend taking home COVID test if your symptoms do not improve. You can develop COVID-19 symptoms up to 21 days following an initial exposure. Complete all medication as prescribed.     ED Prescriptions     Medication Sig Dispense Auth. Provider   azithromycin (ZITHROMAX) 250 MG tablet Take 2 tabs PO x 1 dose, then 1 tab PO QD x 4 days 6 tablet Bing Neighbors, FNP   predniSONE (DELTASONE) 20 MG tablet Take 1 tablet (20 mg total) by mouth daily with breakfast for 5 days. 5 tablet Bing Neighbors, FNP      PDMP not reviewed this encounter.   Bing Neighbors, FNP 11/18/20 [phone]

## 2020-11-18 NOTE — ED Triage Notes (Signed)
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 Pt denies any other SX's.

## 2021-08-30 ENCOUNTER — Emergency Department (HOSPITAL_BASED_OUTPATIENT_CLINIC_OR_DEPARTMENT_OTHER)
Admission: EM | Admit: 2021-08-30 | Discharge: 2021-08-30 | Disposition: A | Discharge status: Home / Self Care | Payer: Self-pay | Attending: Emergency Medicine | Admitting: Emergency Medicine

## 2021-08-30 ENCOUNTER — Other Ambulatory Visit: Payer: Self-pay

## 2021-08-30 ENCOUNTER — Encounter (HOSPITAL_BASED_OUTPATIENT_CLINIC_OR_DEPARTMENT_OTHER): Payer: Self-pay

## 2021-08-30 DIAGNOSIS — R52 Pain, unspecified: Secondary | ICD-10-CM

## 2021-08-30 DIAGNOSIS — R519 Headache, unspecified: Secondary | ICD-10-CM

## 2021-08-30 DIAGNOSIS — M791 Myalgia, unspecified site: Secondary | ICD-10-CM | POA: Insufficient documentation

## 2021-08-30 DIAGNOSIS — J029 Acute pharyngitis, unspecified: Secondary | ICD-10-CM

## 2021-08-30 LAB — GROUP A STREP BY PCR: Group A Strep by PCR: NOT DETECTED

## 2021-08-30 NOTE — ED Triage Notes (Addendum)
Pt presents with a 2 day hx of sore throat, body aches, and a feeling of being hot. Pt reports a white spot on the back of his throat. Pt is concerned he may have strep.

## 2021-08-30 NOTE — Discharge Instructions (Signed)
You were seen today for sore throat.  As discussed, your strep test were negative.  If you develop worsening symptoms or develop a high fever, you may wish to be reevaluated.  I recommend over-the-counter medication for symptom management

## 2021-08-30 NOTE — ED Notes (Signed)
Discharge paperwork given and understood. 

## 2021-08-30 NOTE — ED Provider Notes (Signed)
[facility] EMERGENCY DEPT Provider Note   CSN: [id number] Arrival date & time: 08/30/21  1103     History  Chief Complaint  Patient presents with   Sore Throat    Bruce Griffith is a 31 y.o. male.  Patient presents with 2 days of sore throat, body aches, intermittent headache.  Patient states that he has had strep throat in the past and is concerned that he may have strep throat again.  Patient denies cough, shortness of breath, chest pain, abdominal pain, nausea, vomiting.  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  No relevant past medical history  HPI     Home Medications Prior to Admission medications   Medication Sig Start Date End Date Taking? Authorizing Provider  amoxicillin-clavulanate (AUGMENTIN) 875-125 MG tablet Take 1 tablet by mouth every 12 (twelve) hours. 06/01/20   Wieters, Hallie C, PA-C  azithromycin (ZITHROMAX) 250 MG tablet Take 2 tabs PO x 1 dose, then 1 tab PO QD x 4 days 11/18/20   Bing Neighbors, FNP  diclofenac (VOLTAREN) 50 MG EC tablet Take 1 tablet (50 mg total) by mouth 2 (two) times daily. 08/17/19   Cathie Hoops, Amy V, PA-C  lidocaine (XYLOCAINE) 2 % solution Use as directed 10 mLs in the mouth or throat as needed for mouth pain. 05/30/20   Particia Nearing, PA-C  Pseudoeph-Doxylamine-DM-APAP (NYQUIL PO) Take by mouth.    [provider]  Pseudoephedrine-APAP-DM (DAYQUIL PO) Take by mouth.    [provider]      Allergies    Ceclor [cefaclor] and Septra [sulfamethoxazole-trimethoprim]    Review of Systems   Review of Systems  Constitutional:  Positive for chills. Negative for fever.  HENT:  Positive for sore throat.   Respiratory:  Negative for cough and shortness of breath.   Cardiovascular:  Negative for chest pain.  Gastrointestinal:  Negative for abdominal pain, nausea and vomiting.   Physical Exam Updated Vital  Signs BP 118/78 (BP Location: Right Arm)   Pulse 60   Temp 98.3 F (36.8 C)   Resp 16   Ht 6' (1.829 m)   Wt 131.5 kg   SpO2 98%   BMI 39.33 kg/m  Physical Exam Vitals and nursing note reviewed.  Constitutional:      General: He is not in acute distress. HENT:     Head: Normocephalic.     Right Ear: Tympanic membrane and ear canal normal.     Left Ear: Tympanic membrane and ear canal normal.     Mouth/Throat:     Pharynx: Oropharyngeal exudate and posterior oropharyngeal erythema present.     Tonsils: No tonsillar exudate.  Eyes:     Conjunctiva/sclera: Conjunctivae normal.  Cardiovascular:     Rate and Rhythm: Normal rate and regular rhythm.     Heart sounds: Normal heart sounds.  Pulmonary:     Effort: Pulmonary effort is normal.     Breath sounds: Normal breath sounds.  Abdominal:     Palpations: Abdomen is soft.     Tenderness: There is no abdominal tenderness.  Musculoskeletal:     Cervical back: Normal range of motion.  Skin:    General: Skin is warm and dry.     Capillary Refill: Capillary refill takes less than 2 seconds.  Neurological:     Mental Status: He is alert.    ED Results / Procedures /  Treatments   Labs (all labs ordered are listed, but only abnormal results are displayed) Labs Reviewed  GROUP A STREP BY PCR    EKG None  Radiology No results found.  Procedures Procedures    Medications Ordered in ED Medications - No data to display  ED Course/ Medical Decision Making/ A&P                           Medical Decision Making  The patient presents with a chief complaint of sore throat x2 days.  Differential includes but is not limited to strep pharyngitis, COVID-19, influenza, viral illnesses, meningitis, and others  I ordered lab testing.  Group A strep testing was negative.  The patient has no fever, no neck stiffness.  No meningeal symptoms.  I feel that COVID and flu are unlikely given the lack of fever since the onset of the  illness.  The patient has no cervical adenopathy. I considered treating for strep based on history, but the clinical picture is not consistent at this time with strep. This is likely some other viral illness.  Lung sounds are clear, I see no need at this time for chest x-ray.  The patient may discharge home and use medication for supportive care.  Tylenol, Advil, DayQuil, NyQuil, for symptom management.  If his sore throat worsens or if he develops a high fever, he may need to be reassessed.  Discharge home   Final Clinical Impression(s) / ED Diagnoses Final diagnoses:  Sore throat  Body aches  Acute nonintractable headache, unspecified headache type    Rx / DC Orders ED Discharge Orders     None         Pamala Duffel 08/30/21 1248    Derwood Kaplan, MD 08/31/21 [id number]

## 2021-12-04 ENCOUNTER — Emergency Department (HOSPITAL_BASED_OUTPATIENT_CLINIC_OR_DEPARTMENT_OTHER)
Admission: EM | Admit: 2021-12-04 | Discharge: 2021-12-04 | Disposition: A | Discharge status: Home / Self Care | Payer: Self-pay | Attending: Emergency Medicine | Admitting: Emergency Medicine

## 2021-12-04 ENCOUNTER — Encounter (HOSPITAL_BASED_OUTPATIENT_CLINIC_OR_DEPARTMENT_OTHER): Payer: Self-pay

## 2021-12-04 ENCOUNTER — Other Ambulatory Visit: Payer: Self-pay

## 2021-12-04 DIAGNOSIS — L0212 Furuncle of neck: Secondary | ICD-10-CM | POA: Insufficient documentation

## 2021-12-04 NOTE — ED Triage Notes (Signed)
Pt states he had an abscess on rt. Side of neck that "busted" last night. Slight redness noted and tender to touch

## 2021-12-04 NOTE — Discharge Instructions (Signed)
Today you were seen in the emergency department for your neck boil.    In the emergency department you were evaluated and does not appear that you have an abscess at this time or infection.    At home, please use Tylenol and Motrin with warm compresses for symptoms.    Follow-up with your primary doctor in 2-3 days regarding your visit.  If you do not have a primary doctor you may use the information in this packet to set up an appointment with droppage.  Return immediately to the emergency department if you experience any of the following: Fevers, worsening redness or swelling, or any other concerning symptoms.    Thank you for visiting our Emergency Department. It was a pleasure taking care of you today.

## 2021-12-04 NOTE — ED Notes (Signed)
Reviewed AVS/discharge instruction with patient. Time allotted for and all questions answered. Patient is agreeable for d/c and escorted to ed exit by staff.  

## 2021-12-04 NOTE — ED Provider Notes (Signed)
[facility] EMERGENCY DEPT Provider Note   CSN: [id number] Arrival date & time: 12/04/21  1958     History  Chief Complaint  Patient presents with   Abscess    Bruce Griffith is a 31 y.o. male.  31 year old male previously healthy presents emergency department with neck swelling.  Patient states he has had a cyst on the right side of his neck for several months.  Says that yesterday it came to ahead and that it spontaneously ruptured with some bloody discharge.  Says that since then he has had some swelling in that area and his girlfriend was concerned about possible infection so he came into the emergency department for evaluation.  Says that there is a small amount of pain there but denies any fevers.  Denies any history of diabetes or immunocompromise state.  No other medical problems.    Abscess      Home Medications Prior to Admission medications   Medication Sig Start Date End Date Taking? Authorizing Provider  amoxicillin-clavulanate (AUGMENTIN) 875-125 MG tablet Take 1 tablet by mouth every 12 (twelve) hours. 06/01/20   Wieters, Hallie C, PA-C  azithromycin (ZITHROMAX) 250 MG tablet Take 2 tabs PO x 1 dose, then 1 tab PO QD x 4 days 11/18/20   Bing Neighbors, FNP  diclofenac (VOLTAREN) 50 MG EC tablet Take 1 tablet (50 mg total) by mouth 2 (two) times daily. 08/17/19   Cathie Hoops, Amy V, PA-C  lidocaine (XYLOCAINE) 2 % solution Use as directed 10 mLs in the mouth or throat as needed for mouth pain. 05/30/20   Particia Nearing, PA-C  Pseudoeph-Doxylamine-DM-APAP (NYQUIL PO) Take by mouth.    [provider]  Pseudoephedrine-APAP-DM (DAYQUIL PO) Take by mouth.    [provider]      Allergies    Ceclor [cefaclor] and Septra [sulfamethoxazole-trimethoprim]    Review of Systems   Review of Systems  Physical Exam Updated Vital Signs BP 122/73   Pulse 83   Temp 98.3 F (36.8 C) (Oral)   Resp 16   SpO2 98%  Physical Exam Vitals and  nursing note reviewed.  Constitutional:      General: He is not in acute distress.    Appearance: He is well-developed.  HENT:     Head: Normocephalic and atraumatic.     Right Ear: External ear normal.     Left Ear: External ear normal.     Nose: Nose normal.  Eyes:     Extraocular Movements: Extraocular movements intact.     Conjunctiva/sclera: Conjunctivae normal.     Pupils: Pupils are equal, round, and reactive to light.  Pulmonary:     Effort: Pulmonary effort is normal. No respiratory distress.  Abdominal:     General: There is no distension.     Palpations: Abdomen is soft.  Musculoskeletal:        General: No swelling.     Cervical back: Normal range of motion and neck supple.  Skin:    General: Skin is warm and dry.     Capillary Refill: Capillary refill takes less than 2 seconds.     Comments: Lesion with small amount of induration on right neck.  See image below.  No significant erythema or warmth.  No fluctuant mass noted underneath.  Neurological:     Mental Status: He is alert.  Psychiatric:        Mood and Affect: Mood normal.        Behavior: Behavior normal.  ED Results / Procedures / Treatments   Labs (all labs ordered are listed, but only abnormal results are displayed) Labs Reviewed - No data to display  EKG None  Radiology No results found.  Procedures Procedures   Medications Ordered in ED Medications - No data to display  ED Course/ Medical Decision Making/ A&P                           Medical Decision Making  Bruce Griffith is a 31 y.o. male who presents with lesion to the right side of his neck  Initial Ddx:  Cyst Abscess Cellulitis  MDM:  Feel the patient likely has a cyst that ruptured.  No signs of cellulitis at this time.  No abscess palpated.  Do not feel that antibiotics are warranted currently and we will have the patient treat himself symptomatically with Tylenol, Motrin, and warm compresses.  We will have the  patient follow-up with his primary doctor regarding his symptoms.  Dispo: DC Home. Return precautions discussed including, but not limited to, those listed in the AVS. Allowed pt time to ask questions which were answered fully prior to dc.   Final Clinical Impression(s) / ED Diagnoses Final diagnoses:  Boil of neck    Rx / DC Orders ED Discharge Orders     None         Rondel Baton, MD 12/05/21 2011

## 2022-04-18 ENCOUNTER — Encounter (HOSPITAL_BASED_OUTPATIENT_CLINIC_OR_DEPARTMENT_OTHER): Payer: Self-pay | Admitting: Emergency Medicine

## 2022-04-18 ENCOUNTER — Other Ambulatory Visit: Payer: Self-pay

## 2022-04-18 ENCOUNTER — Emergency Department (HOSPITAL_BASED_OUTPATIENT_CLINIC_OR_DEPARTMENT_OTHER)
Admission: EM | Admit: 2022-04-18 | Discharge: 2022-04-18 | Disposition: A | Discharge status: Home / Self Care | Payer: Self-pay | Attending: Emergency Medicine | Admitting: Emergency Medicine

## 2022-04-18 DIAGNOSIS — F17210 Nicotine dependence, cigarettes, uncomplicated: Secondary | ICD-10-CM | POA: Insufficient documentation

## 2022-04-18 DIAGNOSIS — R55 Syncope and collapse: Secondary | ICD-10-CM | POA: Insufficient documentation

## 2022-04-18 DIAGNOSIS — S61211A Laceration without foreign body of left index finger without damage to nail, initial encounter: Secondary | ICD-10-CM | POA: Insufficient documentation

## 2022-04-18 DIAGNOSIS — W260XXA Contact with knife, initial encounter: Secondary | ICD-10-CM | POA: Insufficient documentation

## 2022-04-18 MED ORDER — LIDOCAINE-EPINEPHRINE 2 %-1:200000 IJ SOLN
INTRAMUSCULAR | Status: AC
Start: 2022-04-18 — End: 2022-04-18
  Filled 2022-04-18: qty 20

## 2022-04-18 NOTE — ED Triage Notes (Signed)
Pt reports cutting his left index finger with a kitchen knife, bleeding controlled at this time, pt reports he had a syncopal episode from "blood loss", reports hitting his head on a kitchen cabinet, denies blood thinners

## 2022-04-18 NOTE — ED Provider Notes (Addendum)
[facility] EMERGENCY Provider Note: Georgena Spurling, MD, FACEP  CSN: [id number] MRN: [id number] ARRIVAL: 04/18/22 at [place]: [place]  Laceration   HISTORY OF PRESENT ILLNESS  04/18/22 5:43 AM Bruce Griffith is a 32 y.o. male who cut his left index finger with a kitchen knife just prior to arrival.  He rates associated pain as a 5 out of 10, burning and tingling in nature.  He states it bled profusely at the time and he had a brief syncopal episode (less than 1 minute) which she attributes to blood loss and not to the site of blood.  He is no longer feeling lightheaded.   Past Medical History:  Diagnosis Date   Laceration of left palm 04/14/2016    Past Surgical History:  Procedure Laterality Date   HAND SURGERY Left    NO PAST SURGERIES     WOUND EXPLORATION Left 04/20/2016   Procedure: LEFT PALM WOUND EXPLORATION;  Surgeon: Leanora Cover, MD;  Location: [facility];  Service: Orthopedics;  Laterality: Left;    Family History  Problem Relation Age of Onset   Diabetes Father    Diabetes Other    Hypertension Other     Social History   Tobacco Use   Smoking status: Some Days    Types: Cigarettes   Smokeless tobacco: Former    Types: Chew    Quit date: 08/15/2020  Vaping Use   Vaping Use: Never used  Substance Use Topics   Alcohol use: Yes    Comment: socially   Drug use: No    Prior to Admission medications   Not on File    Allergies Ceclor [cefaclor] and Septra [sulfamethoxazole-trimethoprim]   REVIEW OF SYSTEMS  Negative except as noted here or in the History of Present Illness.   PHYSICAL EXAMINATION  Initial Vital Signs Blood pressure (!) 141/83, pulse 84, temperature 98.2 F (36.8 C), resp. rate 19, height 6' (1.829 m), weight 133.8 kg, SpO2 99 %.  Examination General: Well-developed, well-nourished male in no acute distress; appearance consistent with age of record HENT: normocephalic; atraumatic Eyes:  Normal appearance Neck: supple Heart: regular rate and rhythm Lungs: clear to auscultation bilaterally Abdomen: soft; nondistended; nontender; bowel sounds present Extremities: No deformity; full range of motion; laceration left index finger with intact tendon function and neurovascularly intact distally:    Neurologic: Awake, alert and oriented; motor function intact in all extremities and symmetric; no facial droop Skin: Warm and dry Psychiatric: Normal mood and affect   RESULTS  Summary of this visit's results, reviewed and interpreted by myself:   EKG Interpretation  Date/Time:    Ventricular Rate:    PR Interval:    QRS Duration:   QT Interval:    QTC Calculation:   R Axis:     Text Interpretation:         Laboratory Studies: No results found for this or any previous visit (from the past 24 hour(s)). Imaging Studies: No results found.  ED COURSE and MDM  Nursing notes, initial and subsequent vitals signs, including pulse oximetry, reviewed and interpreted by myself.  Vitals:   04/18/22 0047 04/18/22 0117  BP: (!) 141/83   Pulse: 84   Resp: 19   Temp: 98.2 F (36.8 C)   SpO2: 99%   Weight:  133.8 kg  Height:  6' (1.829 m)   Medications  lidocaine-EPINEPHrine (XYLOCAINE W/EPI) 2 %-1:200000 (PF) injection (  Given by Other 04/18/22 0559)  The patient states the knife was clean and there appears to be no involvement of deep structures.  I do not believe antibiotics are indicated at this time.  He was advised to return should erythema or an increase in pain occur.  Patient advised to get sutures removed in 10 days.  I suspect the patient's reported syncopal episode was vagal.  It would be hard to imagine a finger laceration causing severe enough blood loss for someone to go unconscious.  Nevertheless his vital signs are normal here and he was able to ambulate without difficulty.  PROCEDURES  Procedures LACERATION REPAIR Performed by: Karen Chafe  Karon Heckendorn Authorized by: Karen Chafe Rahim Astorga Consent: Verbal consent obtained. Risks and benefits: risks, benefits and alternatives were discussed Consent given by: patient Patient identity confirmed: provided demographic data Prepped and Draped in normal sterile fashion Wound explored  Laceration Location: Left index finger  Laceration Length: 2 cm  No Foreign Bodies seen or palpated  Anesthesia: local infiltration  Local anesthetic: lidocaine 2% with epinephrine  Anesthetic total: 5 ml  Irrigation method: Wound cleaner Amount of cleaning: standard  Skin closure: 4-0 Prolene  Number of sutures: 5  Technique: Simple interrupted  Patient tolerance: Patient tolerated the procedure well with no immediate complications.    ED DIAGNOSES     ICD-10-CM   1. Laceration of left index finger without foreign body without damage to nail, initial encounter  S61.211A     2. Vasovagal syncope  R55          Martie Fulgham, Jenny Reichmann, MD 04/18/22 0102    Shanon Rosser, MD 04/18/22 [id number]

## 2022-04-18 NOTE — ED Notes (Signed)
1/2 in lac to left index finger, bleeding controlled

## 2022-04-28 ENCOUNTER — Emergency Department (HOSPITAL_BASED_OUTPATIENT_CLINIC_OR_DEPARTMENT_OTHER)
Admission: EM | Admit: 2022-04-28 | Discharge: 2022-04-28 | Disposition: A | Discharge status: Home / Self Care | Payer: Self-pay | Attending: Emergency Medicine | Admitting: Emergency Medicine

## 2022-04-28 ENCOUNTER — Encounter (HOSPITAL_BASED_OUTPATIENT_CLINIC_OR_DEPARTMENT_OTHER): Payer: Self-pay | Admitting: Emergency Medicine

## 2022-04-28 ENCOUNTER — Other Ambulatory Visit: Payer: Self-pay

## 2022-04-28 DIAGNOSIS — Z4802 Encounter for removal of sutures: Secondary | ICD-10-CM | POA: Insufficient documentation

## 2022-04-28 NOTE — ED Notes (Signed)
Patient verbalizes understanding of discharge instructions. Opportunity for questioning and answers were provided. Patient discharged from ED.  °

## 2022-04-28 NOTE — ED Notes (Signed)
Removed 5 stitches from pts left index finger

## 2022-04-28 NOTE — Discharge Instructions (Signed)
Suture Removal, Care After The following information offers guidance on how to care for yourself after your procedure. Your health care provider may also give you more specific instructions. If you have problems or questions, contact your health care provider. What can I expect after the procedure? After your stitches (sutures) are removed, it is common to have: Some discomfort and swelling in the area. Slight redness in the area. Follow these instructions at home: If you have a dressing: Wash your hands with soap and water for at least 20 seconds before and after you change your bandage (dressing). If soap and water are not available, use hand sanitizer. Change your dressing as told by your health care provider. If your dressing becomes wet or dirty, or develops a bad smell, change it as soon as possible. If your dressing sticks to your skin, pour warm, clean water over it until it loosens and can be removed without pulling apart the wound edges. Pat the area dry with a soft, clean towel. Do not rub the wound because that may cause bleeding. Wound care  Check your wound every day for signs of infection. Check for: More redness, swelling, or pain. Fluid or blood. New warmth, a rash, or hardness at the wound site. Pus or a bad smell. Wash your hands with soap and water for at least 20 seconds before and after touching your wound. If soap and water are not available, use hand sanitizer. Keep the wound area dry and clean. Clean and pat the wound dry as told by your health care provider. Apply cream or ointment only as told by your health care provider. If skin glue or adhesive strips were applied after sutures were removed, leave these closures in place. They may need to stay in place for 2 weeks or longer. If adhesive strip edges start to loosen and curl up, you may trim the loose edges. Do not remove adhesive strips completely unless your health care provider tells you to do that. Continue to  protect the wound from injury. Do not pick at your wound. Picking can cause an infection. Bathing Do not take baths, swim, or use a hot tub until your health care provider approves. Ask your health care provider if you may take showers. Follow these steps for showering: If you have a dressing, remove it before getting into the shower. In the shower, allow soapy water to get on the wound. Avoid scrubbing the wound. When you get out of the shower, dry the wound by patting it with a clean towel. Reapply a dressing over the wound, if needed. Scar care When your wound has completely healed, help decrease the size of your scar by: Wearing sunscreen over the scar or covering it with clothing when you are outside. New scars get sunburned easily, which can make scarring worse. Gently massaging the scarred area. This can decrease scar thickness. General instructions Take over-the-counter and prescription medicines only as told by your health care provider. Keep all follow-up visits. This is important. Contact a health care provider if: You have more redness, swelling, or pain around your wound. You have fluid or blood coming from your wound. You have new warmth, a rash, or hardness at the wound site. You have pus or a bad smell coming from your wound. Your wound opens up. Get help right away if: You have a fever or chills. You have red streaks coming from your wound. Summary After your sutures are removed, it is common to have some discomfort   and swelling in the area. Wash your hands with soap and water before you change your bandage (dressing). Keep the wound area dry and clean. Do not take baths, swim, or use a hot tub until your health care provider approves. This information is not intended to replace advice given to you by your health care provider. Make sure you discuss any questions you have with your health care provider. Document Revised: 07/09/2020 Document Reviewed:  07/09/2020 Elsevier Patient Education  2023 [employer]vier Inc.  

## 2022-04-28 NOTE — ED Triage Notes (Signed)
Pt arrives to ED to have sutures removed from left index finger.

## 2022-04-28 NOTE — ED Provider Notes (Signed)
[facility] Provider Note   CSN: [id number] Arrival date & time: 04/28/22  2706     History  Chief Complaint  Patient presents with   Suture / Staple Removal    Bruce Griffith is a 32 y.o. male who presents for suture removal from his left index finger.  Patient cut his left index finger 10 days ago and had 5 stitches placed.  He reports that he still having some mobility issues due to swelling in the finger as well as sharp shooting pain and numbness along the radial border of the finger.  He has no other complaints at this time.  He is right-hand dominant   Suture / Staple Removal       Home Medications Prior to Admission medications   Not on File      Allergies    Ceclor [cefaclor] and Septra [sulfamethoxazole-trimethoprim]    Review of Systems   Review of Systems  Physical Exam Updated Vital Signs BP 137/84 (BP Location: Right Arm)   Pulse 71   Temp 98.4 F (36.9 C) (Oral)   Resp 20   Ht 6' (1.829 m)   Wt 133.8 kg   SpO2 100%   BMI 40.01 kg/m  Physical Exam Vitals and nursing note reviewed.  Constitutional:      General: He is not in acute distress.    Appearance: He is well-developed. He is not diaphoretic.  HENT:     Head: Normocephalic and atraumatic.  Eyes:     General: No scleral icterus.    Conjunctiva/sclera: Conjunctivae normal.  Cardiovascular:     Rate and Rhythm: Normal rate and regular rhythm.     Heart sounds: Normal heart sounds.  Pulmonary:     Effort: Pulmonary effort is normal. No respiratory distress.     Breath sounds: Normal breath sounds.  Abdominal:     Palpations: Abdomen is soft.     Tenderness: There is no abdominal tenderness.  Musculoskeletal:     Cervical back: Normal range of motion and neck supple.     Comments: Left index finger shows 5 sutures in place.  They are well-healed.  He has some bruising noted to the finger.  Some swelling at the joint however joint is  nontender.  Range of motion motion is limited due to swelling.  No evidence of infection  Skin:    General: Skin is warm and dry.  Neurological:     Mental Status: He is alert.  Psychiatric:        Behavior: Behavior normal.     ED Results / Procedures / Treatments   Labs (all labs ordered are listed, but only abnormal results are displayed) Labs Reviewed - No data to display  EKG None  Radiology No results found.  Procedures .Suture Removal  Date/Time: 04/28/2022 9:53 AM  Performed by: Margarita Mail, PA-C Authorized by: Margarita Mail, PA-C   Consent:    Consent obtained:  Verbal   Consent given by:  Patient   Risks discussed:  Bleeding, pain and wound separation   Alternatives discussed:  No treatment Universal protocol:    Patient identity confirmed:  Verbally with patient Location:    Location:  Upper extremity   Upper extremity location:  Hand Procedure details:    Number of sutures removed:  5 Post-procedure details:    Procedure completion:  Tolerated well, no immediate complications     Medications Ordered in ED Medications - No data to display  ED Course/ Medical Decision Making/ A&P                             Medical Decision Making  Patient here for suture removal.  It appears low risk for dehiscence as the wound appears to be well-healed.  He does have some mobility and sensation issues given this fact we will have him follow-up with orthopedic hand.  He does not appear to have any evidence of infection or septic joint.  Patient appears otherwise appropriate for discharge with supportive care and outpatient follow-up.  Discussed return precautions.        Final Clinical Impression(s) / ED Diagnoses Final diagnoses:  Visit for suture removal    Rx / DC Orders ED Discharge Orders     None         Margarita Mail, PA-C 04/28/22 1000    Elnora Morrison, MD 04/28/22 1542

## 2022-08-11 ENCOUNTER — Emergency Department (HOSPITAL_BASED_OUTPATIENT_CLINIC_OR_DEPARTMENT_OTHER): Payer: No Typology Code available for payment source

## 2022-08-11 ENCOUNTER — Encounter (HOSPITAL_BASED_OUTPATIENT_CLINIC_OR_DEPARTMENT_OTHER): Payer: Self-pay

## 2022-08-11 ENCOUNTER — Other Ambulatory Visit: Payer: Self-pay

## 2022-08-11 ENCOUNTER — Emergency Department (HOSPITAL_BASED_OUTPATIENT_CLINIC_OR_DEPARTMENT_OTHER)
Admission: EM | Admit: 2022-08-11 | Discharge: 2022-08-11 | Disposition: A | Discharge status: Home / Self Care | Payer: No Typology Code available for payment source | Attending: Emergency Medicine | Admitting: Emergency Medicine

## 2022-08-11 DIAGNOSIS — Y92410 Unspecified street and highway as the place of occurrence of the external cause: Secondary | ICD-10-CM | POA: Diagnosis not present

## 2022-08-11 DIAGNOSIS — M542 Cervicalgia: Secondary | ICD-10-CM | POA: Diagnosis present

## 2022-08-11 NOTE — ED Notes (Signed)
Pt. States he's here more for the dizziness than pain. States he's taken approx. 5 of 15 flexeril since being seen at urgent care last week.
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
## 2022-12-11 NOTE — Progress Notes (Deleted)
Office Visit Note  Patient: Bruce Griffith             Date of Birth: Mar 31, 1990           MRN: [id number]             PCP: Julien Girt, PA-C Referring: Julien Girt, PA* Visit Date: 12/25/2022 Occupation: @GUAROCC @  Subjective:  No chief complaint on file.   History of Present Illness: Bruce Griffith is a 32 y.o. male ***     Activities of Daily Living:  Patient reports morning stiffness for *** {minute/hour:19697}.   Patient {ACTIONS;DENIES/REPORTS:21021675::"Denies"} nocturnal pain.  Difficulty dressing/grooming: {ACTIONS;DENIES/REPORTS:21021675::"Denies"} Difficulty climbing stairs: {ACTIONS;DENIES/REPORTS:21021675::"Denies"} Difficulty getting out of chair: {ACTIONS;DENIES/REPORTS:21021675::"Denies"} Difficulty using hands for taps, buttons, cutlery, and/or writing: {ACTIONS;DENIES/REPORTS:21021675::"Denies"}  No Rheumatology ROS completed.   PMFS History:  Patient Active Problem List   Diagnosis Date Noted   Psoriasis     Past Medical History:  Diagnosis Date   Laceration of left palm 04/14/2016    Family History  Problem Relation Age of Onset   Diabetes Father    Diabetes Other    Hypertension Other    Past Surgical History:  Procedure Laterality Date   HAND SURGERY Left    NO PAST SURGERIES     WOUND EXPLORATION Left 04/20/2016   Procedure: LEFT PALM WOUND EXPLORATION;  Surgeon: Betha Loa, MD;  Location: [facility];  Service: Orthopedics;  Laterality: Left;   Social History   Social History Narrative   Not on file   Immunization History  Administered Date(s) Administered   Tdap 04/15/2016     Objective: Vital Signs: There were no vitals taken for this visit.   Physical Exam   Musculoskeletal Exam: ***  CDAI Exam: CDAI Score: -- Patient Global: --; Provider Global: -- Swollen: --; Tender: -- Joint Exam 12/25/2022   No joint exam has been documented for this visit   There is currently no information  documented on the homunculus. Go to the Rheumatology activity and complete the homunculus joint exam.  Investigation: No additional findings.  Imaging: No results found.  Recent Labs: Lab Results  Component Value Date   WBC 10.8 (H) 10/31/2016   HGB 15.5 10/31/2016   PLT 232 10/31/2016   NA 142 10/31/2016   K 4.6 10/31/2016   CL 106 10/31/2016   CO2 27 10/31/2016   GLUCOSE 92 10/31/2016   BUN 11 10/31/2016   CREATININE 0.67 10/31/2016   BILITOT 0.9 10/31/2016   ALKPHOS 77 10/31/2016   AST 18 10/31/2016   ALT 25 10/31/2016   PROT 7.6 10/31/2016   ALBUMIN 3.7 10/31/2016   CALCIUM 9.3 10/31/2016   GFRAA >60 10/31/2016    Speciality Comments: No specialty comments available.  Procedures:  No procedures performed Allergies: Ceclor [cefaclor] and Septra [sulfamethoxazole-trimethoprim]   Assessment / Plan:     Visit Diagnoses: Psoriasis  Polyarthralgia - 07/10/22: anti-CCP 7, ESR 24, uric acid 7.2, ASO 70, CRP 9, RF<10, ANA negative  Family history of rheumatoid arthritis  Orders: No orders of the defined types were placed in this encounter.  No orders of the defined types were placed in this encounter.   Face-to-face time spent with patient was *** minutes. Greater than 50% of time was spent in counseling and coordination of care.  Follow-Up Instructions: No follow-ups on file.   Gearldine Bienenstock, PA-C  Note - This record has been created using Dragon software.  Chart creation errors have been sought, but may not  always  have been located. Such creation errors do not reflect on  the standard of medical care.

## 2022-12-25 ENCOUNTER — Encounter: Payer: Self-pay | Admitting: Rheumatology

## 2022-12-25 DIAGNOSIS — Z8261 Family history of arthritis: Secondary | ICD-10-CM

## 2022-12-25 DIAGNOSIS — M2550 Pain in unspecified joint: Secondary | ICD-10-CM

## 2022-12-25 DIAGNOSIS — L409 Psoriasis, unspecified: Secondary | ICD-10-CM

## 2023-01-01 ENCOUNTER — Encounter: Payer: Self-pay | Admitting: Rheumatology

## 2023-01-29 ENCOUNTER — Ambulatory Visit: Payer: Self-pay | Admitting: Rheumatology

## 2023-02-08 ENCOUNTER — Emergency Department (HOSPITAL_BASED_OUTPATIENT_CLINIC_OR_DEPARTMENT_OTHER)
Admission: EM | Admit: 2023-02-08 | Discharge: 2023-02-08 | Disposition: A | Discharge status: Home / Self Care | Payer: Medicaid Other | Attending: Emergency Medicine | Admitting: Emergency Medicine

## 2023-02-08 ENCOUNTER — Other Ambulatory Visit: Payer: Self-pay

## 2023-02-08 ENCOUNTER — Emergency Department (HOSPITAL_BASED_OUTPATIENT_CLINIC_OR_DEPARTMENT_OTHER): Payer: Medicaid Other

## 2023-02-08 ENCOUNTER — Encounter (HOSPITAL_BASED_OUTPATIENT_CLINIC_OR_DEPARTMENT_OTHER): Payer: Self-pay

## 2023-02-08 DIAGNOSIS — R1031 Right lower quadrant pain: Secondary | ICD-10-CM | POA: Diagnosis present

## 2023-02-08 DIAGNOSIS — D72829 Elevated white blood cell count, unspecified: Secondary | ICD-10-CM | POA: Insufficient documentation

## 2023-02-08 DIAGNOSIS — K529 Noninfective gastroenteritis and colitis, unspecified: Secondary | ICD-10-CM | POA: Diagnosis not present

## 2023-02-08 LAB — COMPREHENSIVE METABOLIC PANEL
ALT: 33 U/L (ref 0–44)
AST: 22 U/L (ref 15–41)
Albumin: 4.5 g/dL (ref 3.5–5.0)
Alkaline Phosphatase: 70 U/L (ref 38–126)
Anion gap: 9 (ref 5–15)
BUN: 10 mg/dL (ref 6–20)
CO2: 27 mmol/L (ref 22–32)
Calcium: 10 mg/dL (ref 8.9–10.3)
Chloride: 102 mmol/L (ref 98–111)
Creatinine, Ser: 0.69 mg/dL (ref 0.61–1.24)
GFR, Estimated: >60 mL/min (ref >60)
Glucose, Bld: 85 mg/dL (ref 70–99)
Potassium: 4.2 mmol/L (ref 3.5–5.1)
Sodium: 138 mmol/L (ref 135–145)
Total Bilirubin: 0.6 mg/dL (ref <1.2)
Total Protein: 8.6 g/dL — ABNORMAL HIGH (ref 6.5–8.1)

## 2023-02-08 LAB — URINALYSIS, ROUTINE W REFLEX MICROSCOPIC
Bilirubin Urine: NEGATIVE
Glucose, UA: NEGATIVE mg/dL
Hgb urine dipstick: NEGATIVE
Ketones, ur: NEGATIVE mg/dL
Leukocytes,Ua: NEGATIVE
Nitrite: NEGATIVE
Protein, ur: NEGATIVE mg/dL
Specific Gravity, Urine: 1.014 (ref 1.005–1.030)
pH: 5.5 (ref 5.0–8.0)

## 2023-02-08 LAB — CBC
HCT: 51 % (ref 39.0–52.0)
Hemoglobin: 16.4 g/dL (ref 13.0–17.0)
MCH: 26.6 pg (ref 26.0–34.0)
MCHC: 32.2 g/dL (ref 30.0–36.0)
MCV: 82.7 fL (ref 80.0–100.0)
Platelets: 301 10*3/uL (ref 150–400)
RBC: 6.17 MIL/uL — ABNORMAL HIGH (ref 4.22–5.81)
RDW: 13.8 % (ref 11.5–15.5)
WBC: 13.3 10*3/uL — ABNORMAL HIGH (ref 4.0–10.5)
nRBC: 0 % (ref 0.0–0.2)

## 2023-02-08 LAB — LIPASE, BLOOD: Lipase: 13 U/L (ref 11–51)

## 2023-02-08 MED ORDER — ONDANSETRON 4 MG PO TBDP
4.0000 mg | ORAL_TABLET | Freq: Three times a day (TID) | ORAL | 0 refills | Status: AC | PRN
Start: 2023-02-08 — End: ?

## 2023-02-08 MED ORDER — IOHEXOL 300 MG/ML  SOLN
100.0000 mL | Freq: Once | INTRAMUSCULAR | Status: AC | PRN
Start: 2023-02-08 — End: 2023-02-08
  Administered 2023-02-08: 100 mL via INTRAVENOUS

## 2023-02-08 MED ORDER — LOPERAMIDE HCL 2 MG PO CAPS
4.0000 mg | ORAL_CAPSULE | Freq: Once | ORAL | Status: AC
  Administered 2023-02-08: 4 mg via ORAL
  Filled 2023-02-08: qty 2

## 2023-02-08 MED ORDER — FENTANYL CITRATE PF 50 MCG/ML IJ SOSY
50.0000 ug | PREFILLED_SYRINGE | Freq: Once | INTRAMUSCULAR | Status: AC
  Administered 2023-02-08: 50 ug via INTRAVENOUS
  Filled 2023-02-08: qty 1

## 2023-02-08 MED ORDER — LOPERAMIDE HCL 2 MG PO CAPS: 2.0000 mg | ORAL_CAPSULE | Freq: Four times a day (QID) | ORAL | 0 refills | Status: AC | PRN

## 2023-02-08 MED ORDER — ONDANSETRON HCL 4 MG/2ML IJ SOLN
4.0000 mg | Freq: Once | INTRAMUSCULAR | Status: AC
  Administered 2023-02-08: 4 mg via INTRAVENOUS
  Filled 2023-02-08: qty 2

## 2023-02-08 MED ORDER — LACTATED RINGERS IV BOLUS
1000.0000 mL | Freq: Once | INTRAVENOUS | Status: AC
  Administered 2023-02-08: 1000 mL via INTRAVENOUS

## 2023-02-08 NOTE — ED Provider Notes (Signed)
[place] EMERGENCY DEPARTMENT AT [facility] Provider Note   CSN: [id number] Arrival date & time: 02/08/23  1330     History Chief Complaint  Patient presents with   Diarrhea    Bruce Griffith is a 32 y.o. male.  Patient without significant past medical history presents to the emergency department with concerns of diarrhea.  Reports that he woke up this morning covered in diarrhea and does not recall having a bowel movement in his sleep.  Endorsing some abdominal pain to the right lower quadrant that has slightly migrated up towards the right upper quadrant.  Denies any fever or chills.  Has had some nausea but denies any vomiting.  No hematemesis, hematochezia, or melanotic stools.  Not currently on any blood thinners and no history of aspirin use or NSAID use.  No prior history of any GI conditions such as Crohn's disease, ulcerative colitis, malabsorption disorders, or any other foodborne allergies.  Denies any contact with any individuals with similar symptoms recently.   Diarrhea      Home Medications Prior to Admission medications   Medication Sig Start Date End Date Taking? Authorizing Provider  loperamide (IMODIUM) 2 MG capsule Take 1 capsule (2 mg total) by mouth 4 (four) times daily as needed for diarrhea or loose stools. 02/08/23  Yes Maryanna Shape A, PA-C  ondansetron (ZOFRAN-ODT) 4 MG disintegrating tablet Take 1 tablet (4 mg total) by mouth every 8 (eight) hours as needed for nausea or vomiting. 02/08/23  Yes Smitty Knudsen, PA-C      Allergies    Ceclor [cefaclor] and Septra [sulfamethoxazole-trimethoprim]    Review of Systems   Review of Systems  Gastrointestinal:  Positive for diarrhea.  All other systems reviewed and are negative.   Physical Exam Updated Vital Signs BP 120/73   Pulse (!) 56   Temp 97.7 F (36.5 C)   Resp 16   Ht 6' (1.829 m)   Wt 131.5 kg   SpO2 99%   BMI 39.33 kg/m  Physical Exam Vitals and nursing note reviewed.   Constitutional:      General: He is not in acute distress.    Appearance: Normal appearance. He is well-developed. He is not ill-appearing.  HENT:     Head: Normocephalic and atraumatic.  Eyes:     Conjunctiva/sclera: Conjunctivae normal.  Cardiovascular:     Rate and Rhythm: Normal rate and regular rhythm.     Heart sounds: No murmur heard. Pulmonary:     Effort: Pulmonary effort is normal. No respiratory distress.     Breath sounds: Normal breath sounds.  Abdominal:     General: Bowel sounds are increased. There is no distension.     Palpations: Abdomen is soft. There is no mass.     Tenderness: There is abdominal tenderness in the right lower quadrant.  Musculoskeletal:        General: No swelling.     Cervical back: Neck supple.  Skin:    General: Skin is warm and dry.     Capillary Refill: Capillary refill takes less than 2 seconds.  Neurological:     Mental Status: He is alert.  Psychiatric:        Mood and Affect: Mood normal.     ED Results / Procedures / Treatments   Labs (all labs ordered are listed, but only abnormal results are displayed) Labs Reviewed  COMPREHENSIVE METABOLIC PANEL - Abnormal; Notable for the following components:      Result Value  Total Protein 8.6 (*)    All other components within normal limits  CBC - Abnormal; Notable for the following components:   WBC 13.3 (*)    RBC 6.17 (*)    All other components within normal limits  LIPASE, BLOOD  URINALYSIS, ROUTINE W REFLEX MICROSCOPIC    EKG None  Radiology CT ABDOMEN PELVIS W CONTRAST  Result Date: 02/08/2023 CLINICAL DATA:  RLQ abdominal pain Diarrhea EXAM: CT ABDOMEN AND PELVIS WITH CONTRAST TECHNIQUE: Multidetector CT imaging of the abdomen and pelvis was performed using the standard protocol following bolus administration of intravenous contrast. RADIATION DOSE REDUCTION: This exam was performed according to the departmental dose-optimization program which includes automated  exposure control, adjustment of the mA and/or kV according to patient size and/or use of iterative reconstruction technique. CONTRAST:  OMNIPAQUE IOHEXOL 300 MG/ML  SOLN COMPARISON:  10/01/2013 FINDINGS: Lower chest: No acute abnormality. Hepatobiliary: No focal liver abnormality is seen. No gallstones, gallbladder wall thickening, or biliary dilatation. Pancreas: Unremarkable. No pancreatic ductal dilatation or surrounding inflammatory changes. Spleen: Normal in size without focal abnormality. Adrenals/Urinary Tract: Adrenal glands are unremarkable. Kidneys are normal, without renal calculi, focal lesion, or hydronephrosis. Bladder is unremarkable. Stomach/Bowel: Stomach is within normal limits. Appendix appears normal. No evidence of bowel wall thickening, distention, or inflammatory changes. Vascular/Lymphatic: No significant vascular findings are present. No enlarged abdominal or pelvic lymph nodes. Reproductive: Prostate is unremarkable. Other: No abdominal wall hernia or abnormality. No abdominopelvic ascites. Musculoskeletal: No acute or significant osseous findings. IMPRESSION: No CT evidence of acute abnormality of the abdomen or pelvis. Normal appendix. Electronically Signed   By: Acquanetta Belling M.D.   On: 02/08/2023 19:42    Procedures Procedures   Medications Ordered in ED Medications  lactated ringers bolus 1,000 mL (1,000 mLs Intravenous New Bag/Given 02/08/23 1628)  ondansetron (ZOFRAN) injection 4 mg (4 mg Intravenous Given 02/08/23 1623)  fentaNYL (SUBLIMAZE) injection 50 mcg (50 mcg Intravenous Given 02/08/23 1625)  iohexol (OMNIPAQUE) 300 MG/ML solution 100 mL (100 mLs Intravenous Contrast Given 02/08/23 1704)  loperamide (IMODIUM) capsule 4 mg (4 mg Oral Given 02/08/23 1900)    ED Course/ Medical Decision Making/ A&P                               Medical Decision Making Amount and/or Complexity of Data Reviewed Labs: ordered. Radiology: ordered.  Risk Prescription drug  management.   This patient presents to the ED for concern of abdominal pain, diarrhea.  Differential diagnosis includes appendicitis, bowel obstruction, gastroenteritis, cholecystitis   Lab Tests:  I Ordered, and personally interpreted labs.  The pertinent results include: Leukocytosis with white count at 13.3, urinalysis evaluations infection, CMP is unremarkable, lipase unremarkable at 13   Imaging Studies ordered:  I ordered imaging studies including CT abdomen pelvis I independently visualized and interpreted imaging which showed no acute abnormalities I agree with the radiologist interpretation   Medicines ordered and prescription drug management:  I ordered medication including fluids, Zofran, fentanyl for dehydration, nausea, pain Reevaluation of the patient after these medicines showed that the patient improved I have reviewed the patients home medicines and have made adjustments as needed   Problem List / ED Course:  Patient without significant past medical history presents to the emergency department concerns of diarrhea.  States that he woke up this morning covered in diarrhea and cannot recall having a bowel movement or waking up with the urge to  defecate as he was unsure when this occurred.  Since then, has had numerous episodes of watery diarrhea.  No recent antibiotic use.  No sick contact.  Primarily concerned that he has been experiencing some intermittent right lower quadrant abdominal pain with radiation towards the right upper quadrant.  History of diverticulosis per patient's report but denies any history of diverticulitis.  No other GI concerns.  Patient also reports that he has remained afebrile and no chills noted.  Given concerning symptoms, basic labs ordered from triage.  Exam also remarkable for notable right lower quadrant tenderness.  CT abdomen pelvis imaging ordered.  Will attempt symptom control with fluids, fentanyl, and Zofran. On reexam the patient, he  reports significant improvement with fluids, Zofran, and fentanyl.  Imodium only given a little while ago so no significant changes in diarrheal symptoms at this time.  CT imaging negative for any acute abdominal abnormalities to account for his current symptoms.  Suspect likely gastroenteritis. No evidence of appendicitis on CT imaging. Encourage patient to manage symptoms at home as best he can with aggressive hydration as well as Imodium for diarrhea and Zofran for nausea.  Discussed strict return precautions with patient such as development of bloody diarrhea, intractable vomiting, or notable dehydration.  No acute indication for antibiotic therapy at this time.  Patient is in agreement with this plan. Discharged home in stable condition.   Final Clinical Impression(s) / ED Diagnoses Final diagnoses:  Gastroenteritis    Rx / DC Orders ED Discharge Orders          Ordered    ondansetron (ZOFRAN-ODT) 4 MG disintegrating tablet  Every 8 hours PRN        02/08/23 2001    loperamide (IMODIUM) 2 MG capsule  4 times daily PRN        02/08/23 2001              Smitty Knudsen, PA-C 02/08/23 2202    Anders Simmonds T, DO 02/09/23 2236

## 2023-02-08 NOTE — ED Triage Notes (Signed)
Pt arrives ambulatory to ED with reports of diarrhea. States that he woke up this morning covered in diarrhea. Was having abdominal pain yesterday.

## 2023-02-08 NOTE — Discharge Instructions (Addendum)
You are seen in the emergency department today for concerns of diarrhea.  Your labs and imaging were all reassuring today without any acutely abnormal findings were noted.  I have sent a prescription for Zofran and loperamide to your pharmacy for management of your symptoms.  If you feel that you are unable to keep any food or liquid down, you may return the emergency department as dehydration is a significant risk with this.  Otherwise, I would recommend following up with your primary care provider for repeat evaluation.
